# Patient Record
Sex: Female | Born: 1980
Health system: Southern US, Community
[De-identification: ages and names within clinical notes are randomized; demographics above are authoritative.]

## PROBLEM LIST (undated history)

## (undated) DIAGNOSIS — J45909 Unspecified asthma, uncomplicated: Secondary | ICD-10-CM

## (undated) DIAGNOSIS — D649 Anemia, unspecified: Secondary | ICD-10-CM

## (undated) DIAGNOSIS — T7840XA Allergy, unspecified, initial encounter: Secondary | ICD-10-CM

## (undated) DIAGNOSIS — M4300 Spondylolysis, site unspecified: Secondary | ICD-10-CM

## (undated) DIAGNOSIS — F32A Depression, unspecified: Secondary | ICD-10-CM

## (undated) DIAGNOSIS — K219 Gastro-esophageal reflux disease without esophagitis: Secondary | ICD-10-CM

## (undated) DIAGNOSIS — F419 Anxiety disorder, unspecified: Secondary | ICD-10-CM

## (undated) HISTORY — DX: Unspecified asthma, uncomplicated: J45.909

## (undated) HISTORY — DX: Depression, unspecified: F32.A

## (undated) HISTORY — DX: Allergy, unspecified, initial encounter: T78.40XA

## (undated) HISTORY — DX: Anemia, unspecified: D64.9

## (undated) HISTORY — PX: NO PAST SURGERIES: SHX2092

## (undated) HISTORY — DX: Gastro-esophageal reflux disease without esophagitis: K21.9

## (undated) HISTORY — DX: Anxiety disorder, unspecified: F41.9

---
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
## 2008-08-27 ENCOUNTER — Inpatient Hospital Stay (HOSPITAL_COMMUNITY): Admission: AD | Admit: 2008-08-27 | Discharge: 2008-08-29 | Payer: Self-pay | Admitting: Obstetrics and Gynecology

## 2011-08-22 LAB — SYPHILIS: RPR W/REFLEX TO RPR TITER AND TREPONEMAL ANTIBODIES, TRADITIONAL SCREENING AND DIAGNOSIS ALGORITHM: RPR Ser Ql: NONREACTIVE

## 2011-08-22 LAB — CBC
HCT: 33.2 — ABNORMAL LOW
HCT: 40.5
Hemoglobin: 11.4 — ABNORMAL LOW
Hemoglobin: 13.7
MCHC: 33.7
MCHC: 34.2
MCV: 84.9
MCV: 85.5
Platelets: 198
Platelets: 241
RBC: 3.88
RBC: 4.77
RDW: 13.2
RDW: 13.4
WBC: 15 — ABNORMAL HIGH
WBC: 15.6 — ABNORMAL HIGH

## 2013-10-10 ENCOUNTER — Encounter: Payer: Self-pay | Admitting: Internal Medicine

## 2013-10-10 ENCOUNTER — Ambulatory Visit (INDEPENDENT_AMBULATORY_CARE_PROVIDER_SITE_OTHER): Payer: Medicaid Other | Admitting: Internal Medicine

## 2013-10-10 VITALS — BP 104/72 | HR 103 | Temp 98.5°F | Resp 10 | Ht 64.0 in | Wt 180.0 lb

## 2013-10-10 DIAGNOSIS — E282 Polycystic ovarian syndrome: Secondary | ICD-10-CM

## 2013-10-10 DIAGNOSIS — L680 Hirsutism: Secondary | ICD-10-CM

## 2013-10-10 DIAGNOSIS — R635 Abnormal weight gain: Secondary | ICD-10-CM

## 2013-10-10 DIAGNOSIS — L708 Other acne: Secondary | ICD-10-CM

## 2013-10-10 DIAGNOSIS — L709 Acne, unspecified: Secondary | ICD-10-CM

## 2013-10-10 LAB — T3, FREE: T3, Free: 2.9 pg/mL (ref 2.3–4.2)

## 2013-10-10 NOTE — Progress Notes (Signed)
Patient ID: Carla Morris, female   DOB: 1981-01-01, 32 y.o.   MRN: [id number]  HPI: Carla Morris [facility] is a 32 y.o. female, referred by Smothers, Cathleen Corti, NP, for evaluation for PCOS due to hirsutism, acne, weight gain.   Fertility/Menstrual cycles: - started to have irregular menstrual cycles in the past 7 mo, they do come every month but at different intervals - no h/o ovarian cysts - per recent TV U/S (09/26/2013): ovaries normal, one 1.3x1.3 cm follicle in left ovary, physiologic. - children: 1, 5 y/o - miscarriages: 0, had 1 abortion - contraception: not currently sexually active - recent PAP smear normal  Acne: - started 5 years ago  Hirsutism: - on face, gotten worse over time  Weight gain: - inability to lose weight, but has been very thin before; 120 lbs before the pregnancy 6 years ago >> after birth 130-140 lbs >> now 180 lbs (~30 lbs gained this year) - no steroid use - no weight loss meds recently - Meals: - Breakfast:yoghurt, coffee - Lunch: small sandwich (tuna), soup - Dinner: chicken/fish, veggies, small portion of pasta/rice - Snacks: 1-2, small Drinks: no sodas, only water  - Diets tried:eats most vegetables, chicken, salmon, small amount of sweets, does not overeat, small meals - Exercise: some, not consistently - running after her 7 y/o son  C/o anxiety/ fatigue.  Treatments tried: - did not try Metformin - did not try Spironolactone - did not try [place] - not on OCPs  Other meds: - SSRIs: no  Labs on 09/05/2013: TSH 1.110  CMP normal Lipid panel: 190/112/42/126.1 U/A normal CBC normal  Pt has FH of overweight in mother after menopause. No FH of DM, but does not know her father's side of the family.   ROS: Constitutional: + weight gain, + fatigue, + subjective hyperthermia Eyes: no blurry vision, no xerophthalmia ENT: no sore throat, no nodules palpated in throat, no dysphagia/odynophagia, no hoarseness Cardiovascular: no  CP/SOB/palpitations/leg swelling Respiratory: no cough/SOB Gastrointestinal: no N/V/D/+ C Musculoskeletal: no muscle/joint aches Skin: + acne, + hair on face, + itching Neurological: no tremors/numbness/tingling/dizziness Psychiatric: no depression/anxiety Low libido  PE: BP 104/72  Pulse 103  Temp(Src) 98.5 F (36.9 C) (Oral)  Resp 10  Ht 5\' 4"  (1.626 m)  Wt 180 lb (81.647 kg)  BMI 30.88 kg/m2  SpO2 98% LMP 10/05/2013 Wt Readings from Last 3 Encounters:  10/10/13 180 lb (81.647 kg)   Constitutional: overweight, in NAD, no full supraclavicular fat pads Eyes: PERRLA, EOMI, no exophthalmos ENT: moist mucous membranes, no thyromegaly, no cervical lymphadenopathy Cardiovascular: RRR, No MRG Respiratory: CTA B Gastrointestinal: abdomen soft, NT, ND, BS+ Musculoskeletal: no deformities, strength intact in all 4 Skin: moist, warm; + acne on face, + dark terminal hair on chin, + vellum on sideburns, no skin tags, no acanthosis nigricans, no purple, wide, stretch marks Neurological: no tremor with outstretched hands, DTR normal in all 4  ASSESSMENT: 1. ?PCOS  PLAN: 1.  I had a long discussion with the patient about the fact that the PCOS is a misnomer, the patient does not actually have to have polycystic ovaries to be diagnosed with the disorder (her TV U/S was normal). This is of sum of several conditions, including insulin resistance (and therefore a higher risk of developing diabetes later in life), weight gain, acne, hirsutism, irregular menstrual cycles, and decreased fertility. - We also discussed about the fact that the treatment is usually targeted to addressing the problem that concerns the patient the most:  acne/hirsutism, weight gain, or fertility, but there is no single treatment for PCOS. The first-line therapy are oral contraceptives. If she is concerned with her weight, we can use metformin; it she is concerned about acne/hirsutism, we can add spironolactone; and if she  is concerned about fertility, I could refer her to reproductive endocrinology for possible use of clomiphene. She is not planning for a pregnancy any time soon, she mentions. - we will first try to see if she has PCOS and r/o prolactinoma, [place]-CAH, DM2/prediabetes, and hypothyroidism - if she does have PCOS, will try OCPs first. Orders Placed This Encounter  Procedures  . LH  . FSH  . Estradiol  . Testosterone, free, total  . Prolactin  . Hemoglobin A1c  . TSH  . T4, free  . T3, free  . Androstenedione  . 17-Hydroxyprogesterone  We can check these today since she is day 6 of her cycle (although ideally checked in day 3). - advised her to join MyChart - will see her back in 4 mo - if the labs are abnormal  PCP: Dr Tracey Harries [facility] [phone])  Office Visit on 10/10/2013  Component Date Value Range Status  . [facility] 10/10/2013 5.37   Final   Comment: Female Reference Range:20-70 yrs     1.5-9.3 mIU/mL>70 yrs       3.1-35.6 mIU/mLFemale Reference Range:Follicular Phase     1.9-12.5 mIU/mLMidcycle             8.7-76.3 mIU/mLLuteal Phase         0.5-16.9 mIU/mL  Post Menopausal      15.9-54.0                           mIU/mLPregnant             <1.5 mIU/mLContraceptives       0.7-5.6 mIU/mL  . [facility] 10/10/2013 8.9   Final   Female Reference Range:  1.4-18.1 mIU/mLFemale Reference Range:Follicular Phase          2.5-10.2 mIU/mLMidCycle Peak          3.4-33.4 mIU/mLLuteal Phase          1.5-9.1 mIU/mLPost Menopausal     23.0-116.3 mIU/mLPregnant          <0.3 mIU/mL  . Estradiol, Free 10/10/2013 0.67   Final   Comment: FEMALE REFERENCE RANGES FOR ESTRADIOL, FREE:                            Follicular Stage:  0.43-5.03 pg/mL                            Mid-cycle Stage:   0.72-5.89 pg/mL                            Luteal Stage:      0.40-5.55 pg/mL                            Postmenopausal:    < or = 0.38 pg/mL  . Estradiol 10/10/2013 32   Final   Comment: FEMALE REFERENCE RANGES FOR  ESTRADIOL:                            Follicular Stage:  39-375  pg/mL                            Mid-cycle Stage:   94-762 pg/mL                            Luteal Stage:      48-440 pg/mL                            Postmenopausal:    < or = 10 pg/mL  . Results Received 10/10/2013 10/21/13   Final   Comment: Reference lab accession: [id number]                          Test performed by:                                     [facility]                                     [address] [place], [place] [address]                                     Phone:  [phone]                          Director:  Pat Patrick, M.D.  . Testosterone 10/10/2013 45  10 - 70 ng/dL Final   Comment:           Tanner Stage       Female              Female                                        I              < 30 ng/dL        < 10 ng/dL                                        II             < 150 ng/dL       < 30 ng/dL                                        III            100-320 ng/dL     < 35 ng/dL  IV             200-970 ng/dL     16-10 ng/dL                                        V/Adult        300-890 ng/dL     96-04 ng/dL                             . Sex Hormone Binding 10/10/2013 28  18 - 114 nmol/L Final  . Testosterone, Free 10/10/2013 8.9* 0.6 - 6.8 pg/mL Final   Comment:                            The concentration of free testosterone is derived from a mathematical                          expression based on constants for the binding of testosterone to sex                          hormone-binding globulin and albumin.  . Testosterone-% Free 10/10/2013 2.0  0.4 - 2.4 % Final  . Prolactin 10/10/2013 2.8   Final   Comment:      Reference Ranges:                                           Female:                       2.1 -  17.1 ng/ml                                           Female:   Pregnant           9.7 - 208.5 ng/mL                                                     Non Pregnant      2.8 -  29.2 ng/mL                                                     Post Menopausal   1.8 -  20.3 ng/mL                                              . Hemoglobin A1C 10/10/2013 5.2  4.6 - 6.5 % Final   Glycemic Control Guidelines for People with Diabetes:Non Diabetic:  <6%Goal of Therapy: <7%Additional Action Suggested:  >8%   . TSH 10/10/2013 0.91  0.35 -  5.50 uIU/mL Final  . Free T4 10/10/2013 0.80  0.60 - 1.60 ng/dL Final  . T3, Free 16/08/9603 2.9  2.3 - 4.2 pg/mL Final  . Androstenedione 10/10/2013 93   Final   Comment:                            Adult Female Reference Ranges for                            Androstenedione, Serum:                                                        Follicular Phase:      35-250 ng/dL                             Luteal Phase:          30-235 ng/dL                             Postmenopausal Phase:  20-75  ng/dL   Mild PCOS. Normal TFTs. Normal prolactin. The 17 HO Progesterone could not be run (was not collected in the appropriate tube), we can check it when she comes back. I have low suspicion for [place]-CAH. Will start OCPs.

## 2013-10-10 NOTE — Patient Instructions (Signed)
Please come back for a follow-up appointment in 4 months. Please stop at the lab. Please join MyChart >> I will send you the results through there. We will decide for further plan when results are back.

## 2013-10-11 LAB — PROLACTIN: Prolactin: 2.8 ng/mL

## 2013-10-13 LAB — TESTOSTERONE, FREE, TOTAL, SHBG
Sex Hormone Binding: 28 nmol/L (ref 18–114)
Testosterone-% Free: 2 % (ref 0.4–2.4)
Testosterone: 45 ng/dL (ref 10–70)

## 2013-10-14 ENCOUNTER — Telehealth: Payer: Self-pay | Admitting: Internal Medicine

## 2013-10-14 NOTE — Telephone Encounter (Signed)
Carla Morris, Please have the schedulers change PCP in the system to him. I will send him the note, but I usually prefer to have everything back before I do so - some labs are still pending.

## 2013-10-14 NOTE — Telephone Encounter (Signed)
Carla Morris at Dr Bonney Leitz office, [facility] [phone]) and advised her that some of the pt's labs are still pending. She stated ok to wait for labs and then send a completed report/notes. Be advised.

## 2013-10-14 NOTE — Telephone Encounter (Signed)
Primary care physician, Dr Tracey Harries is requesting referral notes to be faxed to their office at [phone] attn: Grover Canavan; Call back#[phone], for any questions. Please advise.

## 2013-10-14 NOTE — Telephone Encounter (Signed)
OK, will definitely send the report when note completed.

## 2013-10-21 LAB — ESTRADIOL, FREE

## 2013-10-24 ENCOUNTER — Encounter: Payer: Self-pay | Admitting: Internal Medicine

## 2013-10-24 DIAGNOSIS — E282 Polycystic ovarian syndrome: Secondary | ICD-10-CM | POA: Insufficient documentation

## 2013-10-24 MED ORDER — NORGESTIMATE-ETH ESTRADIOL 0.25-35 MG-MCG PO TABS: 1.0000 | ORAL_TABLET | Freq: Every day | ORAL | Status: DC

## 2014-02-06 ENCOUNTER — Encounter: Payer: Self-pay | Admitting: Internal Medicine

## 2014-02-06 ENCOUNTER — Ambulatory Visit (INDEPENDENT_AMBULATORY_CARE_PROVIDER_SITE_OTHER): Payer: Medicaid Other | Admitting: Internal Medicine

## 2014-02-06 VITALS — BP 118/62 | HR 100 | Temp 97.9°F | Resp 12 | Wt 185.0 lb

## 2014-02-06 DIAGNOSIS — E282 Polycystic ovarian syndrome: Secondary | ICD-10-CM

## 2014-02-06 MED ORDER — NORGESTIMATE-ETH ESTRADIOL 0.25-35 MG-MCG PO TABS: 1.0000 | ORAL_TABLET | Freq: Every day | ORAL | Status: DC

## 2014-02-06 MED ORDER — METFORMIN HCL 500 MG PO TABS: 1000.0000 mg | ORAL_TABLET | Freq: Two times a day (BID) | ORAL | Status: DC

## 2014-02-06 NOTE — Patient Instructions (Signed)
Please start Metformin 500 mg with dinner x 5 days. If you tolerate this well, add another Metformin tablet (500 mg) with breakfast x 5 days. If you tolerate this well, add another metformin tablet with dinner (total 1000 mg) x 5 days. If you tolerate this well, add another metformin tablets with breakfast (total 1000 mg). Continue with 1000 mg of metformin twice a day with breakfast and dinner.  Try to get the Metformin from [facility] or get the brand name Glucophage.   Please return in 6 months.

## 2014-02-06 NOTE — Progress Notes (Signed)
Patient ID: Carla Morris, female   DOB: [birth year], 33 y.o.   MRN: [id number]  HPI: Carla Morris is a 33 y.o. female, returning for f/iu for PCOS. Last visit 4 mo ago.   Works out at [employer] and tried to have a [employer] but does not lose weight. She is very frustrated about this.  Reviewed hx: Fertility/Menstrual cycles: - started to have irregular menstrual cycles in the past 7 mo, they do come every month but at different intervals - no h/o ovarian cysts - per recent TV U/S (09/26/2013): ovaries normal, one 1.3x1.3 cm follicle in left ovary, physiologic. - children: 1, 5 y/o - miscarriages: 0, had 1 abortion - contraception: not currently sexually active - recent PAP smear normal  Acne: - started 5 years ago  Hirsutism: - on face, gotten worse over time  Weight gain: - inability to lose weight, but has been very thin before; 120 lbs before the pregnancy 6 years ago >> after birth 130-140 lbs >> now 180 lbs (~30 lbs gained this year) - no steroid use - no weight loss meds recently - Meals: - Breakfast:yoghurt, coffee - Lunch: small sandwich (tuna), soup - Dinner: chicken/fish, veggies, small portion of pasta/rice - Snacks: 1-2, small Drinks: no sodas, only water  - Diets tried:eats most vegetables, chicken, salmon, small amount of sweets, does not overeat, small meals - Exercise: some, not consistently - running after her 74 y/o son  C/o anxiety/ fatigue.  Treatments tried: - did not try Metformin - did not try Spironolactone - did not try [place] - started on OCPs 09/2013  Other meds: - SSRIs: no  Labs on 09/05/2013: TSH 1.110  CMP normal Lipid panel: 190/112/42/126.1 U/A normal CBC normal  At last visit, we checked the following labs: Component     Latest Ref Rng 10/10/2013  Testosterone     10 - 70 ng/dL 45  Sex Hormone Binding     18 - 114 nmol/L 28  Testosterone Free     0.6 - 6.8 pg/mL 8.9 (H)  Testosterone-% Free     0.4 - 2.4 % 2.0   Estradiol, Free      0.67  Estradiol      32  Results received      10/21/13  LH      5.37  FSH      8.9  Prolactin      2.8  Hemoglobin A1C     4.6 - 6.5 % 5.2  TSH     0.35 - 5.50 uIU/mL 0.91  Free T4     0.60 - 1.60 ng/dL 4.09  T3, Free     2.3 - 4.2 pg/mL 2.9  Androstenedione      93   Dx: mild PCOS. Normal TFTs. Normal prolactin. We started OCPs, she does well on these. The 17 HO Progesterone could not be run (was not collected in the appropriate tube). However, I have low suspicion for [place]-CAH.  She c/o still having hirsutism and weight gain despite healthy diet and exercising.   I reviewed pt's medications, allergies, PMH, social hx, family hx and no changes required, except as mentioned above.  ROS: Constitutional: + weight gain, + fatigue, + subjective hyperthermia Eyes: no blurry vision, no xerophthalmia ENT: no sore throat, no nodules palpated in throat, no dysphagia/odynophagia, no hoarseness Cardiovascular: no CP/SOB/palpitations/leg swelling Respiratory: no cough/SOB Gastrointestinal: no N/V/D/C Musculoskeletal: no muscle/joint aches Skin: no acne, + hair on face Neurological: no tremors/numbness/tingling/dizziness  PE:  BP 118/62  Pulse 100  Temp(Src) 97.9 F (36.6 C) (Oral)  Resp 12  Wt 185 lb (83.915 kg)  SpO2 96% LMP 10/05/2013 Wt Readings from Last 3 Encounters:  02/06/14 185 lb (83.915 kg)  10/10/13 180 lb (81.647 kg)   Constitutional: overweight, in NAD, no full supraclavicular fat pads Eyes: PERRLA, EOMI, no exophthalmos ENT: moist mucous membranes, no thyromegaly, no cervical lymphadenopathy Cardiovascular: RRR, No MRG Respiratory: CTA B Gastrointestinal: abdomen soft, NT, ND, BS+ Musculoskeletal: no deformities, strength intact in all 4 Skin: moist, warm; + dark terminal hair on chin, + vellum on sideburns, no skin tags, no acanthosis nigricans, no purple, wide, stretch marks Neurological: no tremor with outstretched hands, DTR  normal in all 4  ASSESSMENT: 1. Mild PCOS - TV U/S was normal - mild increase in free T (8.9)  PLAN: 1.  Pt with mild PCOS, doing well on OCPs, although with not much improvement in hirsutism - we discussed about Spironolactone >> we ann add this at next visit; I advised her to give the OCPs at least few more months due to the slow impact on the hair shafts - for help with weight loss, we can add Metformin - start low dose and advance to 1000 mg bid. She agrees with this. - will need a new HbA1c, testosterone at next visit. She was out of OCPs x 2 weeks now so will check testosterone at next visit - refilled OCPs - will see her back in 6 mo   PCP: Dr Tracey Harriesavid [facility] [phone])

## 2014-08-06 ENCOUNTER — Telehealth: Payer: Self-pay | Admitting: *Deleted

## 2014-08-06 ENCOUNTER — Encounter: Payer: Self-pay | Admitting: *Deleted

## 2014-08-06 NOTE — Telephone Encounter (Signed)
Sent pt the message via MyChart.

## 2014-08-06 NOTE — Telephone Encounter (Signed)
Message from MyChart Pt Questionnaire submission:  Is there anything else you would like to ask your physician? Are there any clinical trials for PCOS?   Please advise.

## 2014-08-06 NOTE — Telephone Encounter (Signed)
Yes:  She can go to clinicaltrials.gov and search by "PCOS" - or use this link:  [employer]be

## 2014-08-10 ENCOUNTER — Encounter: Payer: Self-pay | Admitting: Internal Medicine

## 2014-08-10 ENCOUNTER — Ambulatory Visit (INDEPENDENT_AMBULATORY_CARE_PROVIDER_SITE_OTHER): Payer: Medicaid Other | Admitting: Internal Medicine

## 2014-08-10 VITALS — BP 118/68 | HR 98 | Temp 98.2°F | Resp 12 | Wt 188.0 lb

## 2014-08-10 DIAGNOSIS — E282 Polycystic ovarian syndrome: Secondary | ICD-10-CM

## 2014-08-10 MED ORDER — SPIRONOLACTONE 50 MG PO TABS: 50.0000 mg | ORAL_TABLET | Freq: Two times a day (BID) | ORAL | Status: DC

## 2014-08-10 MED ORDER — METFORMIN HCL ER 500 MG PO TB24: ORAL_TABLET | ORAL | Status: DC

## 2014-08-10 NOTE — Patient Instructions (Signed)
Please collect the urine collection and bring it to the lab in a week.  Patient information (Up-to-Date): Collection of a 24-hour urine specimen  - You should collect every drop of urine during each 24-hour period. It does not matter how much or little urine is passed each time, as long as every drop is collected. - Begin the urine collection in the morning after you wake up, after you have emptied your bladder for the first time. - Urinate (empty the bladder) for the first time and flush it down the toilet. Note the exact time (eg, 6:15 AM). You will begin the urine collection at this time. - Collect every drop of urine during the day and night in an empty collection bottle. Store the bottle at room temperature or in the refrigerator. - If you need to have a bowel movement, any urine passed with the bowel movement should be collected. Try not to include feces with the urine collection. If feces does get mixed in, do not try to remove the feces from the urine collection bottle. - Finish by collecting the first urine passed the next morning, adding it to the collection bottle. This should be within ten minutes before or after the time of the first morning void on the first day (which was flushed). In this example, you would try to void between 6:05 and 6:25 on the second day. - If you need to urinate one hour before the final collection time, drink a full glass of water so that you can void again at the appropriate time. If you have to urinate 20 minutes before, try to hold the urine until the proper time. - Please note the exact time of the final collection, even if it is not the same time as when collection began on day 1. - The bottle(s) may be kept at room temperature for a day or two, but should be kept cool or refrigerated for longer periods of time.  Please start Spironolactone 50 mg daily at dinnertime, and after 2 days, start 50 mg 2x a day. Please go to your PCP's office for a nurse visit for BP  check and for labs 1 week after you start Spironolactone.  Please start Metformin 500 mg with dinner x 4 days. If you tolerate this well, add another Metformin tablet (500 mg) with breakfast x 4 days. If you tolerate this well, add another metformin tablet with dinner (total 1000 mg) x 4 days. If you tolerate this well, add another metformin tablet with breakfast (total 1000 mg). Continue with 1000 mg of metformin 2x a day with breakfast and dinner.  Please come back for a follow-up appointment in 6 months

## 2014-08-10 NOTE — Progress Notes (Signed)
Patient ID: Carla Morris, female   DOB: Apr 16, 1981, 33 y.o.   MRN: [id number]  HPI: Sherel Fennell Gonsoulin is a 33 y.o. female, returning for f/iu for PCOS. Last visit 6 mo ago.   We started Metformin 1000 mg bid after last visit. She stopped Metformin (diarrhea) and OCPs (weight gain) 1 month ago >> weight decreased from 193 >> 187 lbs.  She is again very frustrated about her weight. No exercise now as she is very busy, but tries to have a healthy diet. She feels that she should lose weight on her diet.  She c/o still having hirsutism and weight gain despite healthy diet and being on OCPs.   Reviewed hx: Fertility/Menstrual cycles: - started to have irregular menstrual cycles in the past 7 mo, they do come every month but at different intervals - no h/o ovarian cysts - per recent TV U/S (09/26/2013): ovaries normal, one 1.3x1.3 cm follicle in left ovary, physiologic. - children: 1, 5 y/o - miscarriages: 0, had 1 abortion - contraception: not currently sexually active - recent PAP smear normal  Acne: - started 5 years ago  Hirsutism: - on face, gotten worse over time  Weight gain: - inability to lose weight, but has been very thin before; 120 lbs before the pregnancy 6 years ago >> after birth 130-140 lbs >> now 180 lbs (~30 lbs gained this year) - no steroid use - no weight loss meds recently - Meals: - Breakfast:yoghurt, coffee - Lunch: small sandwich (tuna), soup - Dinner: chicken/fish, veggies, small portion of pasta/rice - Snacks: 1-2, small Drinks: no sodas, only water  - Diets tried:eats most vegetables, chicken, salmon, small amount of sweets, does not overeat, small meals - Exercise: some, not consistently - running after her 47 y/o son  Treatments tried: - OCPs   Other meds: - SSRIs: no  I reviewed pt's medications, allergies, PMH, social hx, family hx and no changes required, except as mentioned above.  ROS: Constitutional: + weight gain, then loss, + fatigue, +  subjective hyperthermia (hot flushes) Eyes: no blurry vision, no xerophthalmia ENT: no sore throat, no nodules palpated in throat, no dysphagia/odynophagia, no hoarseness Cardiovascular: no CP/SOB/no palpitations/leg swelling Respiratory: no cough/SOB Gastrointestinal: no N/V/D/C Musculoskeletal: no muscle aches/no joint aches Skin: no acne, + hair on face Neurological: no tremors/numbness/tingling/dizziness  PE: BP 118/68  Pulse 98  Temp(Src) 98.2 F (36.8 C) (Oral)  Resp 12  Wt 188 lb (85.276 kg)  SpO2 95% LMP 10/05/2013 Wt Readings from Last 3 Encounters:  08/10/14 188 lb (85.276 kg)  02/06/14 185 lb (83.915 kg)  10/10/13 180 lb (81.647 kg)   Constitutional: overweight, in NAD, no full supraclavicular fat pads Eyes: PERRLA, EOMI, no exophthalmos ENT: moist mucous membranes, no thyromegaly, no cervical lymphadenopathy Cardiovascular: RRR, No MRG Respiratory: CTA B Gastrointestinal: abdomen soft, NT, ND, BS+ Musculoskeletal: no deformities, strength intact in all 4 Skin: moist, warm; + dark terminal hair on chin, + vellum on sideburns, no skin tags, no acanthosis nigricans, no purple, wide, stretch marks Neurological: no tremor with outstretched hands, DTR normal in all 4  ASSESSMENT: 1. Mild PCOS - TV U/S was normal - mild increase in free T (8.9) Labs on 09/05/2013: TSH 1.110  CMP normal Lipid panel: 190/112/42/126.1 U/A normal CBC normal  Component     Latest Ref Rng 10/10/2013  Testosterone     10 - 70 ng/dL 45  Sex Hormone Binding     18 - 114 nmol/L 28  Testosterone Free  0.6 - 6.8 pg/mL 8.9 (H)  Testosterone-% Free     0.4 - 2.4 % 2.0  Estradiol, Free      0.67  Estradiol      32  Results received      10/21/13  LH      5.37  FSH      8.9  Prolactin      2.8  Hemoglobin A1C     4.6 - 6.5 % 5.2  TSH     0.35 - 5.50 uIU/mL 0.91  Free T4     0.60 - 1.60 ng/dL 1.61  T3, Free     2.3 - 4.2 pg/mL 2.9  Androstenedione      93  Dx: mild  PCOS. Normal TFTs. Normal prolactin. The 17 HO Progesterone could not be run (was not collected in the appropriate tube). However, low suspicion for Tinsman-CAH.  PLAN: 1.  Pt with mild PCOS, was doing well on OCPs, although with not much improvement in hirsutism and with increase weight gain >> stopped 1 mo ago, and does not want to restart. She does have menses every month. - we discussed about Spironolactone >> we will add this now: 50 mg bid >> needs nurse visit for BP check and lab visit for potassium in 1 week. - for help with weight loss, we can add MetforminXR  - start low dose and advance to 1000 mg bid. She agrees with this.  - will need a new HbA1c, testosterone at next lab visit. I will add a 24h urine free cortisol level to r/o [place] sd. Although I have low suspicion for this. - will see her back in 6 mo   Patient Instructions  Please collect the urine collection and bring it to the lab in a week.  Patient information (Up-to-Date): Collection of a 24-hour urine specimen  - You should collect every drop of urine during each 24-hour period. It does not matter how much or little urine is passed each time, as long as every drop is collected. - Begin the urine collection in the morning after you wake up, after you have emptied your bladder for the first time. - Urinate (empty the bladder) for the first time and flush it down the toilet. Note the exact time (eg, 6:15 AM). You will begin the urine collection at this time. - Collect every drop of urine during the day and night in an empty collection bottle. Store the bottle at room temperature or in the refrigerator. - If you need to have a bowel movement, any urine passed with the bowel movement should be collected. Try not to include feces with the urine collection. If feces does get mixed in, do not try to remove the feces from the urine collection bottle. - Finish by collecting the first urine passed the next morning, adding it to the  collection bottle. This should be within ten minutes before or after the time of the first morning void on the first day (which was flushed). In this example, you would try to void between 6:05 and 6:25 on the second day. - If you need to urinate one hour before the final collection time, drink a full glass of water so that you can void again at the appropriate time. If you have to urinate 20 minutes before, try to hold the urine until the proper time. - Please note the exact time of the final collection, even if it is not the same time as when collection began  on day 1. - The bottle(s) may be kept at room temperature for a day or two, but should be kept cool or refrigerated for longer periods of time.  Please start Spironolactone 50 mg daily at dinnertime, and after 2 days, start 50 mg 2x a day. Please go to your PCP's office for a nurse visit for BP check and for labs 1 week after you start Spironolactone.  Please start Metformin 500 mg with dinner x 4 days. If you tolerate this well, add another Metformin tablet (500 mg) with breakfast x 4 days. If you tolerate this well, add another metformin tablet with dinner (total 1000 mg) x 4 days. If you tolerate this well, add another metformin tablet with breakfast (total 1000 mg). Continue with 1000 mg of metformin 2x a day with breakfast and dinner.  Please come back for a follow-up appointment in 6 months   PCP: Dr Tracey Harries, NP [facility] [phone])

## 2015-01-21 DIAGNOSIS — F32A Depression, unspecified: Secondary | ICD-10-CM | POA: Insufficient documentation

## 2015-02-01 ENCOUNTER — Encounter: Payer: Self-pay | Admitting: Internal Medicine

## 2015-02-08 ENCOUNTER — Ambulatory Visit: Payer: Medicaid Other | Admitting: Internal Medicine

## 2015-02-12 ENCOUNTER — Encounter: Payer: Self-pay | Admitting: Internal Medicine

## 2015-02-15 NOTE — Telephone Encounter (Signed)
Please check into this.  Thank you.

## 2016-12-15 ENCOUNTER — Emergency Department (HOSPITAL_BASED_OUTPATIENT_CLINIC_OR_DEPARTMENT_OTHER): Payer: Medicaid Other

## 2016-12-15 ENCOUNTER — Emergency Department (HOSPITAL_BASED_OUTPATIENT_CLINIC_OR_DEPARTMENT_OTHER)
Admission: EM | Admit: 2016-12-15 | Discharge: 2016-12-15 | Disposition: A | Discharge status: Home / Self Care | Payer: Medicaid Other | Attending: Emergency Medicine | Admitting: Emergency Medicine

## 2016-12-15 ENCOUNTER — Encounter (HOSPITAL_BASED_OUTPATIENT_CLINIC_OR_DEPARTMENT_OTHER): Payer: Self-pay | Admitting: *Deleted

## 2016-12-15 DIAGNOSIS — Z79899 Other long term (current) drug therapy: Secondary | ICD-10-CM | POA: Diagnosis not present

## 2016-12-15 DIAGNOSIS — M545 Low back pain, unspecified: Secondary | ICD-10-CM

## 2016-12-15 DIAGNOSIS — Z7984 Long term (current) use of oral hypoglycemic drugs: Secondary | ICD-10-CM | POA: Diagnosis not present

## 2016-12-15 HISTORY — DX: Spondylolysis, site unspecified: M43.00

## 2016-12-15 LAB — PREGNANCY, URINE: Preg Test, Ur: NEGATIVE

## 2016-12-15 MED ORDER — ONDANSETRON 4 MG PO TBDP
4.0000 mg | ORAL_TABLET | Freq: Once | ORAL | Status: AC
  Administered 2016-12-15: 4 mg via ORAL
  Filled 2016-12-15: qty 1

## 2016-12-15 MED ORDER — DIAZEPAM 5 MG PO TABS
10.0000 mg | ORAL_TABLET | Freq: Once | ORAL | Status: AC
  Administered 2016-12-15: 10 mg via ORAL
  Filled 2016-12-15: qty 2

## 2016-12-15 MED ORDER — ONDANSETRON 4 MG PO TBDP
ORAL_TABLET | ORAL | Status: AC
  Administered 2016-12-15: 4 mg via ORAL
  Filled 2016-12-15: qty 1

## 2016-12-15 MED ORDER — NAPROXEN 500 MG PO TABS: 500.0000 mg | ORAL_TABLET | Freq: Two times a day (BID) | ORAL | 0 refills | Status: DC

## 2016-12-15 MED ORDER — METHOCARBAMOL 500 MG PO TABS
1000.0000 mg | ORAL_TABLET | Freq: Once | ORAL | Status: AC
Start: 2016-12-15 — End: 2016-12-15
  Administered 2016-12-15: 1000 mg via ORAL
  Filled 2016-12-15: qty 2

## 2016-12-15 MED ORDER — HYDROMORPHONE HCL 1 MG/ML IJ SOLN
1.0000 mg | Freq: Once | INTRAMUSCULAR | Status: AC
  Administered 2016-12-15: 1 mg via INTRAVENOUS
  Filled 2016-12-15: qty 1

## 2016-12-15 MED ORDER — OXYCODONE-ACETAMINOPHEN 5-325 MG PO TABS
2.0000 | ORAL_TABLET | Freq: Once | ORAL | Status: AC
  Administered 2016-12-15: 2 via ORAL
  Filled 2016-12-15: qty 2

## 2016-12-15 MED ORDER — OXYCODONE HCL 5 MG PO TABS: 5.0000 mg | ORAL_TABLET | ORAL | 0 refills | Status: DC | PRN

## 2016-12-15 MED ORDER — KETOROLAC TROMETHAMINE 60 MG/2ML IM SOLN
60.0000 mg | Freq: Once | INTRAMUSCULAR | Status: AC
  Administered 2016-12-15: 60 mg via INTRAMUSCULAR
  Filled 2016-12-15: qty 2

## 2016-12-15 MED ORDER — METHOCARBAMOL 500 MG PO TABS: 500.0000 mg | ORAL_TABLET | Freq: Two times a day (BID) | ORAL | 0 refills | Status: DC

## 2016-12-15 MED FILL — oxyCODONE HCL 5 MG TABS: 5 | 2 days supply | Qty: 15 | Fill #0

## 2016-12-15 MED FILL — NAPROXEN 500 MG TABLET: 500 | 15 days supply | Qty: 30 | Fill #0

## 2016-12-15 MED FILL — METHOCARBAMOL 500 MG TABLET: 500 | 10 days supply | Qty: 20 | Fill #0

## 2016-12-15 NOTE — ED Provider Notes (Signed)
[facility] Note   CSN: [id number] Arrival date & time: 12/15/16  1126     History   Chief Complaint Chief Complaint  Patient presents with  . Back Pain    HPI Carla Morris is a 36 y.o. 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 Patient reports pain radiates to the sides but not down into her buttock or legs. She reports she has been able to walk and drive with pain but without weakness. No loss of bowel or bladder control.  He denies abdominal pain, nausea or vomiting. She has taken naproxen and Robaxin without relief. Patient reports chronic aching of her back due to her pars defect but does not regularly take medication for this.  She's never had pain like this before.   The history is provided by the patient and medical records. No language interpreter was used.    Past Medical History:  Diagnosis Date  . Pars defect     Patient Active Problem List   Diagnosis Date Noted  . PCOS (polycystic ovarian syndrome) 10/24/2013    History reviewed. No pertinent surgical history.  OB History    No data available       Home Medications    Prior to Admission medications   Medication Sig Start Date End Date Taking? Authorizing Provider  Fiber-Vit C-Calcium (FIBER/C/EXTRA CALCIUM PO) Take 1 each by mouth daily.    Historical Provider, MD  metFORMIN (GLUCOPHAGE-XR) 500 MG 24 hr tablet Take by mouth 1000 mg 2x a day with meals 08/10/14   Carlus Pavlovristina Gherghe, MD  methocarbamol (ROBAXIN) 500 MG tablet Take 1 tablet (500 mg total) by mouth 2 (two) times daily. 12/15/16   Winfrey Chillemi, PA-C  naproxen (NAPROSYN) 500 MG tablet Take 1 tablet (500 mg total) by mouth 2 (two) times daily  with a meal. 12/15/16   Terrez Ander, PA-C  Omega-3 Fatty Acids (FISH OIL) 300 MG CAPS Take 1 capsule by mouth daily.    Historical Provider, MD  oxyCODONE (ROXICODONE) 5 MG immediate release tablet Take 1 tablet (5 mg total) by mouth every 4 (four) hours as needed for severe pain. 12/15/16   Korynne Dols, PA-C  spironolactone (ALDACTONE) 50 MG tablet Take 1 tablet (50 mg total) by mouth 2 (two) times daily. 08/10/14   Carlus Pavlovristina Gherghe, MD  VITAMIN K PO Take 1 capsule by mouth daily.    Historical Provider, MD    Family History No family history on file.  Social History Social History  Substance Use Topics  . Smoking status: Never Smoker  . Smokeless tobacco: Never Used  . Alcohol use No     Allergies   Aspirin   Review of Systems Review of Systems  Constitutional: Negative for fatigue and fever.  Respiratory: Negative for chest tightness and shortness of breath.   Cardiovascular: Negative for chest pain.  Gastrointestinal: Negative for abdominal pain, diarrhea, nausea and vomiting.  Genitourinary: Negative for dysuria, frequency, hematuria and urgency.  Musculoskeletal: Positive for back pain. Negative for gait problem, joint swelling, neck pain and neck stiffness.  Skin: Negative for rash.  Neurological: Negative for weakness, light-headedness, numbness and headaches.  All other systems reviewed and are negative.  Physical Exam Updated Vital Signs BP 127/94   Pulse 83   Temp 98.1 F (36.7 C) (Oral)   Resp 18   Ht 5' 3.5" (1.613 m)   Wt 86.2 kg   LMP 12/14/2016   SpO2 100%   BMI 33.13 kg/m   Physical Exam  Constitutional: She appears well-developed and well-nourished. No distress.  HENT:  Head: Normocephalic and atraumatic.  Mouth/Throat: Oropharynx is clear and moist. No oropharyngeal exudate.  Eyes: Conjunctivae are normal.  Neck: Normal range of motion. Neck supple.  Full ROM without pain  Cardiovascular: Normal rate, regular rhythm and  intact distal pulses.   Pulmonary/Chest: Effort normal and breath sounds normal. No respiratory distress. She has no wheezes.  Abdominal: Soft. She exhibits no distension. There is no tenderness.  Musculoskeletal:  Decreased range of motion of the T-spine and L-spine due to severe pain in the L-spine with movement No midline tenderness to the  T-spine with some midline tenderness to the L-spine Tenderness to palpation of the bilateral paraspinous muscles of the L-spine  Lymphadenopathy:    She has no cervical adenopathy.  Neurological: She is alert. She has normal reflexes.  Reflex Scores:      Patellar reflexes are 2+ on the right side and 2+ on the left side.      Achilles reflexes are 2+ on the right side and 2+ on the left side. Speech is clear and goal oriented, follows commands Normal 5/5 strength in lower extremities bilaterally including dorsiflexion and plantar flexion Sensation normal to light and sharp touch Moves extremities without ataxia, coordination intact Antalgic and slow gait, no foot drop or dragging of either leg Normal balance No Clonus  Skin: Skin is warm and dry. No rash noted. She is not diaphoretic. No erythema.  Psychiatric: She has a normal mood and affect. Her behavior is normal.  Nursing note and vitals reviewed.    ED Treatments / Results  Labs (all labs ordered are listed, but only abnormal results are displayed) Labs Reviewed  PREGNANCY, URINE     Radiology Dg Lumbar Spine Complete  Result Date: 12/15/2016 CLINICAL DATA:  Low back pain EXAM: LUMBAR SPINE - COMPLETE 4+ VIEW COMPARISON:  12/22/2015 FINDINGS: Suspect L5 pars defects as seen on prior study. Normal alignment. No acute fracture. Disc spaces are maintained. IMPRESSION: Suspect L5 pars defects.  No acute findings. Electronically Signed   By: Charlett Nose M.D.   On: 12/15/2016 12:24    Procedures Procedures (including critical care time)  Medications Ordered in ED Medications    oxyCODONE-acetaminophen (PERCOCET/ROXICET) 5-325 MG per tablet 2 tablet (2 tablets Oral Given 12/15/16 1244)  diazepam (VALIUM) tablet 10 mg (10 mg Oral Given 12/15/16 1244)  ketorolac (TORADOL) injection 60 mg (60 mg Intramuscular Given 12/15/16 1500)  HYDROmorphone (DILAUDID) injection 1 mg (1 mg Intravenous Given 12/15/16 1552)  methocarbamol (ROBAXIN) tablet 1,000 mg (1,000 mg Oral Given 12/15/16 1653)     Initial Impression / Assessment and Plan / ED Course  I have reviewed the triage vital signs and the nursing notes.  Pertinent labs & imaging results that were available during my care of the patient were reviewed by me and considered in my medical decision making (see chart for details).  Clinical Course as of Dec 15 1702  Fri Dec 15, 2016  1300 No acute abnormality including no fracture noted. Patient continues to have significant pain. She was able to ambulate to the bathroom but required assistance with a walker. She remains without  weakness in her legs but does have worsening pain with movement.  No radiation of the pain. DG Lumbar Spine Complete [HM]  1345 She continues to have back pain with movement. She is able to roll in the bed without difficulty.  [HM]  1640 Patient with improved pain after Dilaudid administration. She is able to ambulate unassisted in the hallway but reports continued pain.  [HM]  1703 [place] narcotics database accessed and patient has not filled any narcotic prescriptions in the last year.  [HM]    Clinical Course User Index [HM] Hiawatha Merriott, PA-C    No acute abnormalities of patient's x-ray. She remains neurologically intact. No evidence of cauda equina. Patient requiring multiple doses of pain control and muscle relaxers however is not ambulatory without assistance. She is to see her primary care provider in 2 days for further evaluation. Pt states understanding and is in agreement with the plan.  Final Clinical Impressions(s) / ED Diagnoses   Final  diagnoses:  Acute midline low back pain without sciatica    New Prescriptions New Prescriptions   METHOCARBAMOL (ROBAXIN) 500 MG TABLET    Take 1 tablet (500 mg total) by mouth 2 (two) times daily.   NAPROXEN (NAPROSYN) 500 MG TABLET    Take 1 tablet (500 mg total) by mouth 2 (two) times daily with a meal.   OXYCODONE (ROXICODONE) 5 MG IMMEDIATE RELEASE TABLET    Take 1 tablet (5 mg total) by mouth every 4 (four) hours as needed for severe pain.     Dahlia Client Benjamin Casanas, PA-C 12/15/16 1705    Jacalyn Lefevre, MD 12/18/16 1500

## 2016-12-15 NOTE — ED Notes (Signed)
Patient is pale and diaphoretic. The patient also has nausea. PA aware and meds given

## 2016-12-15 NOTE — ED Notes (Signed)
Attempted to stand the patient. The patient is screaming and yelling with any movement. Patient unable to stand without yelling in pain.

## 2016-12-15 NOTE — ED Notes (Signed)
Pt unable to ambulate due to pain

## 2016-12-15 NOTE — ED Triage Notes (Signed)
Lower back pain this am. 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

## 2016-12-15 NOTE — ED Notes (Signed)
Patient up and ambulatory with assistance of this RN. Able to ambulate on own with walker. The patient able to mover around better. The patient's Vitals WNL - Robaxin given as ordered and waiting for mother to come and pick her up

## 2016-12-15 NOTE — Discharge Instructions (Signed)
1. Medications: Naproxen 500 mg 2 times per day, Tylenol 1 g 4 times per day, Robaxin 500 mg 2 times per day, oxycodone 5 mg every 4 hours as needed for severe or breakthrough pain usual home medications 2. Treatment: rest, drink plenty of fluids, gentle stretching as discussed, alternate ice and heat 3. Follow Up: Please followup with your primary doctor in 3 days for discussion of your diagnoses and further evaluation after today's visit; if you do not have a primary care doctor use the resource guide provided to find one;  Return to the ER for worsening back pain, difficulty walking, loss of bowel or bladder control or other concerning symptoms

## 2017-06-28 ENCOUNTER — Emergency Department (HOSPITAL_BASED_OUTPATIENT_CLINIC_OR_DEPARTMENT_OTHER)
Admission: EM | Admit: 2017-06-28 | Discharge: 2017-06-29 | Disposition: A | Discharge status: Home / Self Care | Payer: Self-pay | Attending: Emergency Medicine | Admitting: Emergency Medicine

## 2017-06-28 ENCOUNTER — Encounter (HOSPITAL_BASED_OUTPATIENT_CLINIC_OR_DEPARTMENT_OTHER): Payer: Self-pay | Admitting: *Deleted

## 2017-06-28 DIAGNOSIS — K7689 Other specified diseases of liver: Secondary | ICD-10-CM | POA: Insufficient documentation

## 2017-06-28 DIAGNOSIS — R110 Nausea: Secondary | ICD-10-CM | POA: Insufficient documentation

## 2017-06-28 DIAGNOSIS — R1013 Epigastric pain: Secondary | ICD-10-CM | POA: Insufficient documentation

## 2017-06-28 DIAGNOSIS — K769 Liver disease, unspecified: Secondary | ICD-10-CM

## 2017-06-28 LAB — COMPREHENSIVE METABOLIC PANEL
ALT: 19 U/L (ref 14–54)
ANION GAP: 11 (ref 5–15)
AST: 16 U/L (ref 15–41)
Albumin: 4.6 g/dL (ref 3.5–5.0)
Alkaline Phosphatase: 60 U/L (ref 38–126)
BILIRUBIN TOTAL: 0.4 mg/dL (ref 0.3–1.2)
BUN: 16 mg/dL (ref 6–20)
CO2: 26 mmol/L (ref 22–32)
Calcium: 9.2 mg/dL (ref 8.9–10.3)
Chloride: 103 mmol/L (ref 101–111)
Creatinine, Ser: 0.6 mg/dL (ref 0.44–1.00)
GFR calc Af Amer: >60 mL/min (ref >60)
Glucose, Bld: 97 mg/dL (ref 65–99)
POTASSIUM: 3.4 mmol/L — AB (ref 3.5–5.1)
Sodium: 140 mmol/L (ref 135–145)
TOTAL PROTEIN: 8 g/dL (ref 6.5–8.1)

## 2017-06-28 LAB — CBC WITH DIFFERENTIAL/PLATELET
Basophils Absolute: 0 10*3/uL (ref 0.0–0.1)
Basophils Relative: 0 %
Eosinophils Absolute: 0.2 10*3/uL (ref 0.0–0.7)
Eosinophils Relative: 1 %
HCT: 41.1 % (ref 36.0–46.0)
Hemoglobin: 14.4 g/dL (ref 12.0–15.0)
Lymphocytes Relative: 21 %
Lymphs Abs: 2.7 10*3/uL (ref 0.7–4.0)
MCH: 28.4 pg (ref 26.0–34.0)
MCHC: 35 g/dL (ref 30.0–36.0)
MCV: 81.1 fL (ref 78.0–100.0)
Monocytes Absolute: 0.7 10*3/uL (ref 0.1–1.0)
Monocytes Relative: 5 %
Neutro Abs: 9.3 10*3/uL — ABNORMAL HIGH (ref 1.7–7.7)
Neutrophils Relative %: 73 %
Platelets: 302 10*3/uL (ref 150–400)
RBC: 5.07 MIL/uL (ref 3.87–5.11)
RDW: 13.2 % (ref 11.5–15.5)
WBC: 12.9 10*3/uL — ABNORMAL HIGH (ref 4.0–10.5)

## 2017-06-28 LAB — LIPASE, BLOOD: Lipase: 25 U/L (ref 11–51)

## 2017-06-28 MED ORDER — SODIUM CHLORIDE 0.9 % IV BOLUS (SEPSIS)
1000.0000 mL | Freq: Once | INTRAVENOUS | Status: AC
  Administered 2017-06-28: 1000 mL via INTRAVENOUS

## 2017-06-28 MED ORDER — ONDANSETRON HCL 4 MG/2ML IJ SOLN
4.0000 mg | Freq: Once | INTRAMUSCULAR | Status: AC | PRN
  Administered 2017-06-28: 4 mg via INTRAVENOUS
  Filled 2017-06-28: qty 2

## 2017-06-28 NOTE — ED Triage Notes (Signed)
Pt presents with "hard knot just above umbilicas x 3 days decreased appetite and N/V pain begins after eating denies fever

## 2017-06-28 NOTE — ED Notes (Addendum)
Patient has been vomiting for the past 3 days 2 hours after she eat.  Vomitus is undigested food.  Patient took Dulcolax 2 tabs this afternoon thinking that she is constipated but she stated that she has been having BM everyday in the past 3 days.   She wants to cover the basics first before coming to the ED per patient. Patient stated that she felt a knot in the mid upper region of her abdomen.

## 2017-06-29 ENCOUNTER — Emergency Department (HOSPITAL_COMMUNITY): Payer: Self-pay

## 2017-06-29 ENCOUNTER — Emergency Department (HOSPITAL_BASED_OUTPATIENT_CLINIC_OR_DEPARTMENT_OTHER): Payer: Self-pay

## 2017-06-29 LAB — HCG, SERUM, QUALITATIVE: PREG SERUM: NEGATIVE

## 2017-06-29 MED ORDER — IOPAMIDOL (ISOVUE-300) INJECTION 61%
100.0000 mL | Freq: Once | INTRAVENOUS | Status: AC | PRN
  Administered 2017-06-29: 100 mL via INTRAVENOUS

## 2017-06-29 MED ORDER — GI COCKTAIL ~~LOC~~
30.0000 mL | Freq: Once | ORAL | Status: AC
  Administered 2017-06-29: 30 mL via ORAL
  Filled 2017-06-29 (×2): qty 30

## 2017-06-29 NOTE — ED Provider Notes (Signed)
[facility]-EMERGENCY DEPT Provider Note   CSN: [id number] Arrival date & time: 06/28/17  2139     History   Chief Complaint Chief Complaint  Patient presents with  . Abdominal Pain    HPI Carla Morris is a 36 y.o. female w PMHx of PCOS, Presents with epigastric abdominal pain and vomiting 3 days. Patient states epigastric pain occurs after eating, followed by vomiting at least one of her meals per day. She states pain is sharp and intense. Vomit is nonbilious and nonbloody. Reports normal bowel movements with last one today. She also reports associated decrease in appetite. No medications tried prior to visit. Denies vaginal bleeding or discharge, fever, chills, chest pain, shortness of breath, blood in stool, urinary symptoms, or any other symptoms today. The history is provided by the patient.    Past Medical History:  Diagnosis Date  . Pars defect     Patient Active Problem List   Diagnosis Date Noted  . PCOS (polycystic ovarian syndrome) 10/24/2013    History reviewed. No pertinent surgical history.  OB History    No data available       Home Medications    Prior to Admission medications   Not on File    Family History History reviewed. No pertinent family history.  Social History Social History  Substance Use Topics  . Smoking status: Never Smoker  . Smokeless tobacco: Never Used  . Alcohol use No     Allergies   Aspirin   Review of Systems Review of Systems  Constitutional: Positive for appetite change. Negative for chills and fever.  HENT: Negative for trouble swallowing.   Respiratory: Negative for shortness of breath.   Cardiovascular: Negative for chest pain.  Gastrointestinal: Positive for abdominal pain, nausea and vomiting. Negative for blood in stool, constipation and diarrhea.  Genitourinary: Negative for dysuria, frequency, vaginal bleeding and vaginal discharge.  Musculoskeletal: Negative.   Skin: Negative for color change.    Allergic/Immunologic: Negative for immunocompromised state.  Neurological: Negative.      Physical Exam Updated Vital Signs BP 129/83 (BP Location: Left Arm)   Pulse 95   Temp 98.4 F (36.9 C) (Oral)   Resp 18   Ht 5\' 5"  (1.651 m)   Wt 90.7 kg (200 lb)   LMP 06/07/2017   SpO2 96%   BMI 33.28 kg/m   Physical Exam  Constitutional: She appears well-developed and well-nourished. No distress.  Well-appearing, not in distress  HENT:  Head: Normocephalic and atraumatic.  Eyes: Conjunctivae are normal.  Cardiovascular: Normal rate, regular rhythm, normal heart sounds and intact distal pulses.  Exam reveals no friction rub.   No murmur heard. Pulmonary/Chest: Effort normal and breath sounds normal. No respiratory distress. She has no wheezes. She has no rales.  Abdominal: Soft. Bowel sounds are normal. She exhibits no distension and no mass. There is tenderness (eipgastric and LUQ). There is no rigidity, no rebound, no guarding, no CVA tenderness and negative Murphy's sign. No hernia.  Musculoskeletal: Normal range of motion.  Neurological: She is alert.  Skin: Skin is warm.  Psychiatric: She has a normal mood and affect. Her behavior is normal.  Nursing note and vitals reviewed.    ED Treatments / Results  Labs (all labs ordered are listed, but only abnormal results are displayed) Labs Reviewed  COMPREHENSIVE METABOLIC PANEL - Abnormal; Notable for the following:       Result Value   Potassium 3.4 (*)    All other components within normal  limits  CBC WITH DIFFERENTIAL/PLATELET - Abnormal; Notable for the following:    WBC 12.9 (*)    Neutro Abs 9.3 (*)    All other components within normal limits  LIPASE, BLOOD  HCG, SERUM, QUALITATIVE    EKG  EKG Interpretation None       Radiology Ct Abdomen Pelvis W Contrast  Result Date: 06/29/2017 CLINICAL DATA:  Knot just above umbilicus. EXAM: CT ABDOMEN AND PELVIS WITH CONTRAST TECHNIQUE: Multidetector CT imaging of  the abdomen and pelvis was performed using the standard protocol following bolus administration of intravenous contrast. CONTRAST:  ISOVUE-300 IOPAMIDOL (ISOVUE-300) INJECTION 61% COMPARISON:  None. FINDINGS: Lower chest: No acute abnormality. Hepatobiliary: Nonspecific hypodensity in the right hepatic lobe measuring approximately 4.9 x 4 x 3.3 cm. Differential possibilities may include a cavernous hemangioma, focal nodular hyperplasia, adenoma or potentially an abscess among some possibilities though not exclusive. There is slight bulging of the joint capsule due to this lesion. Ultrasound or CT/ MRI with liver lesion protocol without and with contrast may help for better characterization. No biliary dilatation. The gallbladder is free of stones. Pancreas: Normal Spleen: Normal Adrenals/Urinary Tract: Normal Stomach/Bowel: Contrast distention of the stomach with normal small bowel rotation. No bowel obstruction or inflammation. Normal appendix. No acute large bowel abnormality is apparent. Vascular/Lymphatic: No significant vascular findings are present. No enlarged abdominal or pelvic lymph nodes. Reproductive: Small nabothian cysts are noted at the cervix. No uterine mass or adnexal abnormality. Other: No free air nor free fluid. Small umbilical fat containing hernia. Musculoskeletal: Small central disc herniation L4-5. No acute nor suspicious osseous abnormalities. IMPRESSION: 1. Hypodense mass in the right hepatic lobe slightly bulging the liver surface measuring 4.9 x 4 x 3.3 cm. Findings are nonspecific. Differential considerations might include a hemangioma, focal nodular hyperplasia, adenoma or possibly abscess some possibilities though not exclusive. Non emergent cross-sectional imaging with liver tumor protocol and/or ultrasound may help for further characterization. 2. Small fat containing umbilical hernia without evidence of incarceration. 3. Mild central disc herniation L4-5. 4. No acute bowel  inflammation or obstruction. Electronically Signed   By: Tollie Eth M.D.   On: 06/29/2017 02:08    Procedures Procedures (including critical care time)  Medications Ordered in ED Medications  ondansetron [facility]) injection 4 mg (4 mg Intravenous Given 06/28/17 2224)  sodium chloride 0.9 % bolus 1,000 mL (0 mLs Intravenous Stopped 06/29/17 0139)  gi cocktail (Maalox,Lidocaine,Donnatal) (30 mLs Oral Given 06/29/17 0137)  iopamidol (ISOVUE-300) 61 % injection 100 mL (100 mLs Intravenous Contrast Given 06/29/17 0131)     Initial Impression / Assessment and Plan / ED Course  I have reviewed the triage vital signs and the nursing notes.  Pertinent labs & imaging results that were available during my care of the patient were reviewed by me and considered in my medical decision making (see chart for details).     Pt w epigastric abdominal pain and N/V. Patient is nontoxic, nonseptic appearing, in no apparent distress.  Patient's pain and other symptoms adequately managed in emergency department.  Fluid bolus given.  Labs, imaging and vitals reviewed.  CT abd showing hypodense mass in the right hepatic lobe, no acute abdominal pathology. Patient does not meet the SIRS or Sepsis criteria.  On repeat exam patient does not have a surgical abdomen and there are no peritoneal signs.  No indication of appendicitis, bowel obstruction, bowel perforation, cholecystitis, diverticulitis, PID or ectopic pregnancy. CT findings and recommendation for MRI discussed with patient by  Dr. Nicanor Alcon. Patient discharged home with symptomatic treatment for gastritis and given strict instructions for follow-up with their primary care physician about visit and CT findings.  Pt safe for discharge.  Patient discussed with and seen by Dr. Madilyn Hook, who agrees with care plan.  Discussed results, findings, treatment and follow up. Patient advised of return precautions. Patient verbalized understanding and agreed with plan.  Final  Clinical Impressions(s) / ED Diagnoses   Final diagnoses:  Epigastric abdominal pain  Nausea  Liver lesion    New Prescriptions There are no discharge medications for this patient.    Russo, [place] N, PA-C 06/29/17 1535    Tilden Fossa, MD 06/30/17 [phone]

## 2017-06-29 NOTE — Discharge Instructions (Signed)
Please read instructions below. Begin taking omeprazole 2 times per day for 1 week.  Schedule an appointment with your primary care provider to follow up on your visit today. Return to the ER for worsening pain, fever, if you stop having bowel movements, or new or concerning symptoms.

## 2017-06-29 NOTE — ED Notes (Signed)
Patient transported to CT 

## 2017-06-29 NOTE — ED Notes (Signed)
Pt verbalizes understanding of d/c instructions and denies any further needs at this time. 

## 2018-08-26 IMAGING — CT CT ABD-PELV W/ CM
2 of 4 series · 15 of 46 positions shown, 17 images · IV contrast (APPLIED)
Comparison: None.

CLINICAL DATA: Knot just above umbilicus.

EXAM:
CT ABDOMEN AND PELVIS WITH CONTRAST
TECHNIQUE: Multidetector CT imaging of the abdomen and pelvis was performed
using the standard protocol following bolus administration of
intravenous contrast.
CONTRAST:  100mL PRJXRY-FUU IOPAMIDOL (PRJXRY-FUU) INJECTION 61%

[Series 2: axial st · axial · 0.86mm/px · z∈[-470,-10]mm · 12 of 102 slices shown, 14 images]
[im 5/102  soft-tissue]
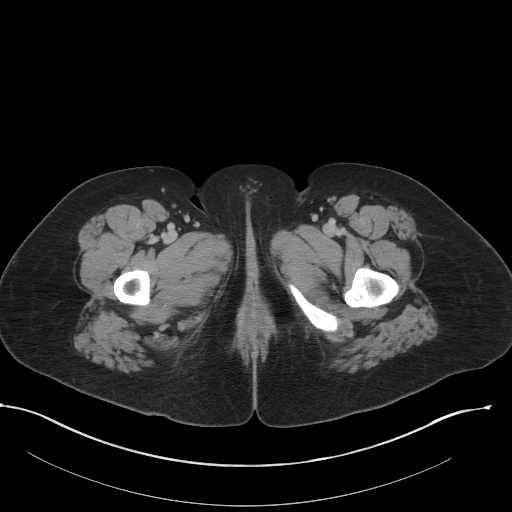
[im 5/102  bone]
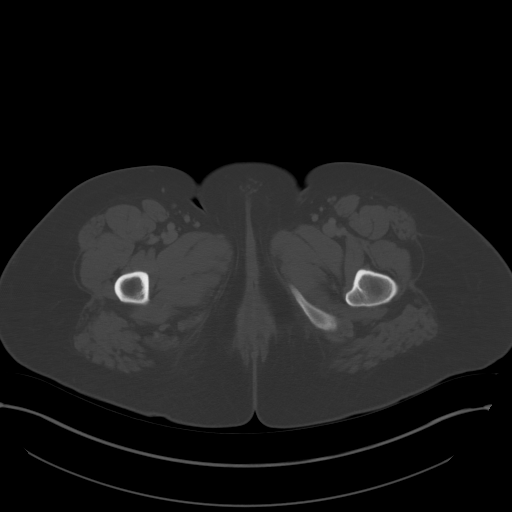
[im 13/102  soft-tissue]
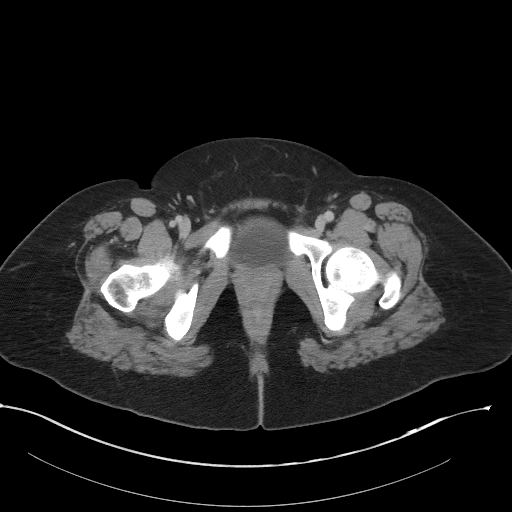
[im 22/102  soft-tissue]
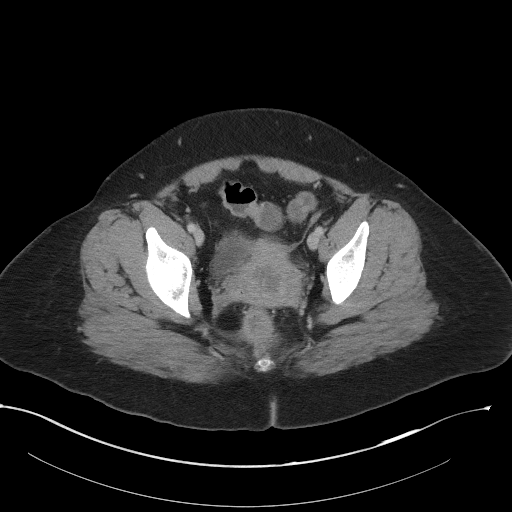
[im 30/102  soft-tissue]
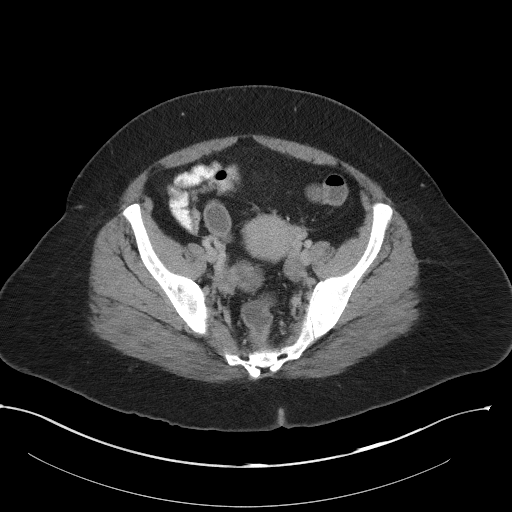
[im 38/102  soft-tissue]
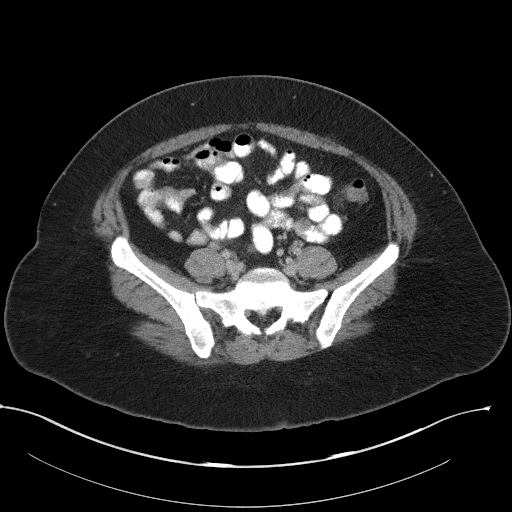
[im 47/102  soft-tissue]
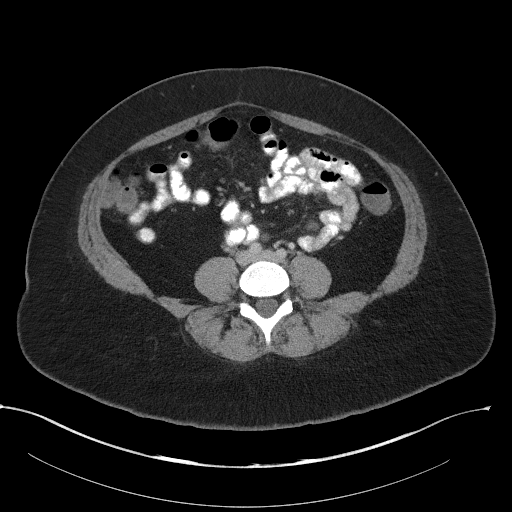
[im 55/102  soft-tissue]
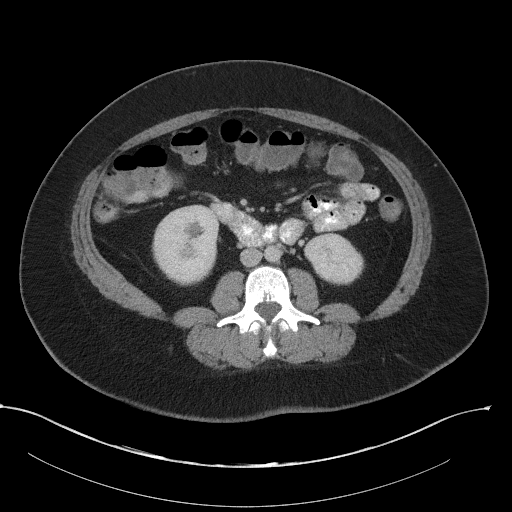
[im 64/102  soft-tissue]
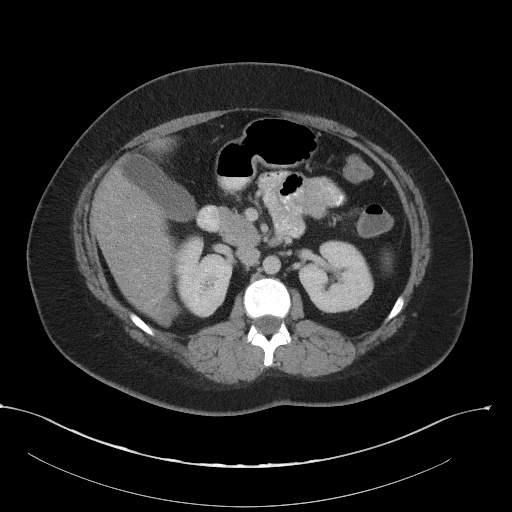
[im 72/102  soft-tissue]
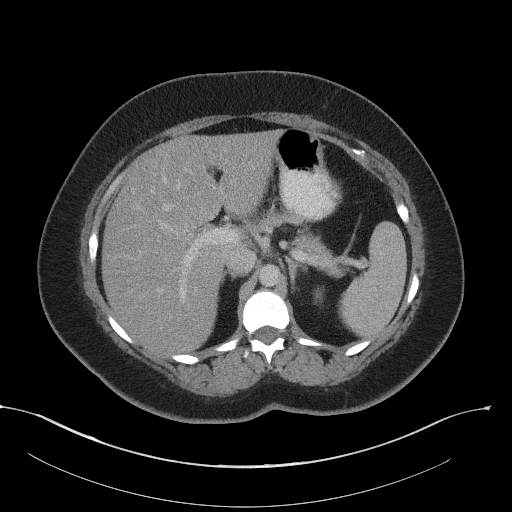
[im 72/102  bone]
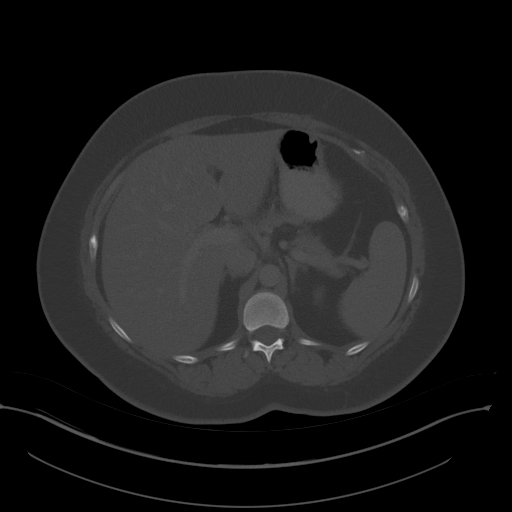
[im 80/102  soft-tissue]
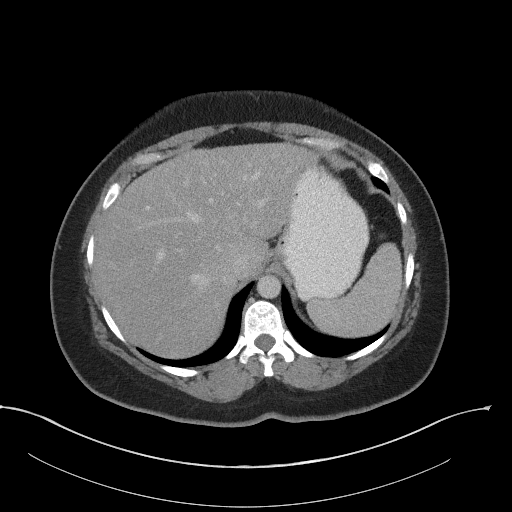
[im 89/102  soft-tissue]
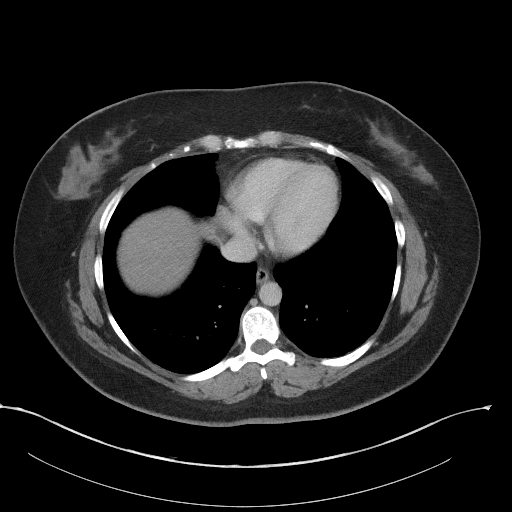
[im 97/102  soft-tissue]
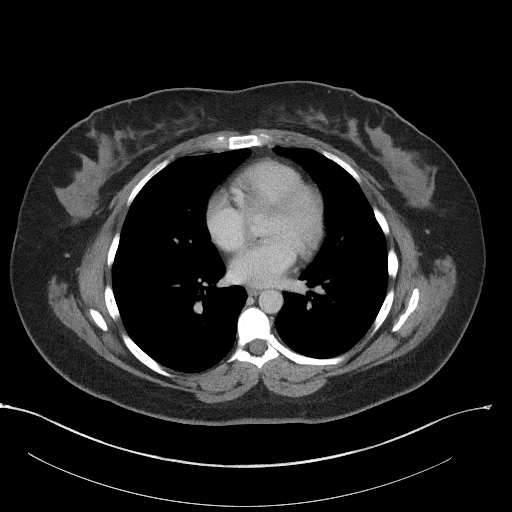

[Series 5: coronal st · coronal · 0.70mm/px · 3 of 95 slices shown]
[im 32/95  soft-tissue]
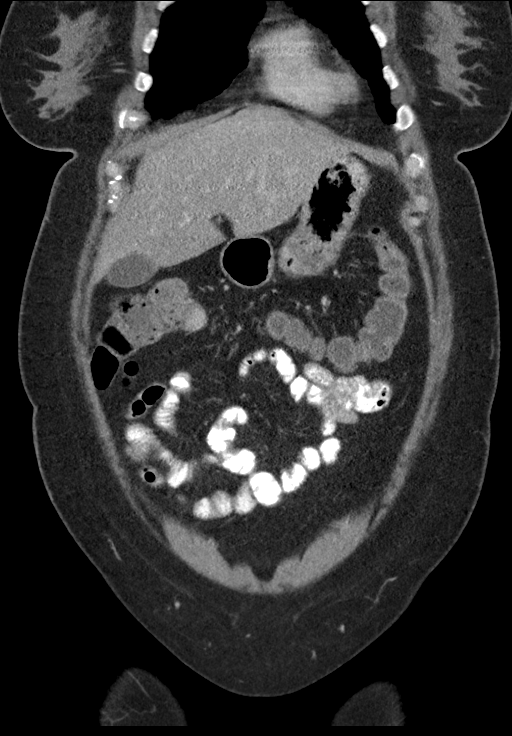
[im 42/95  soft-tissue]
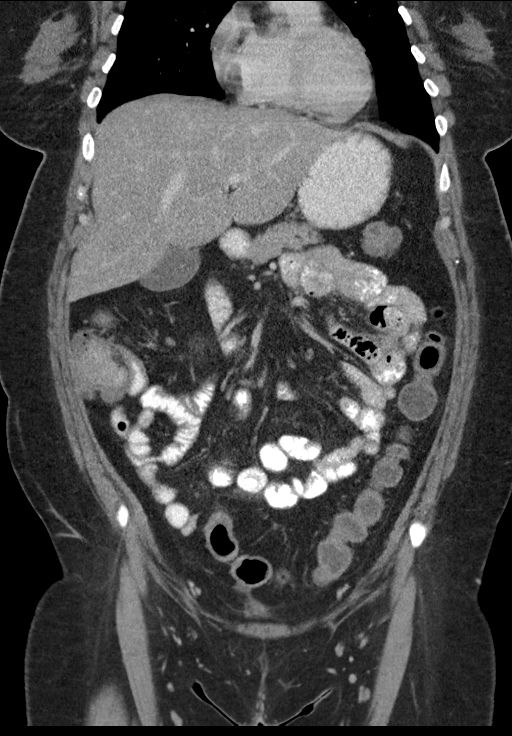
[im 53/95  soft-tissue]
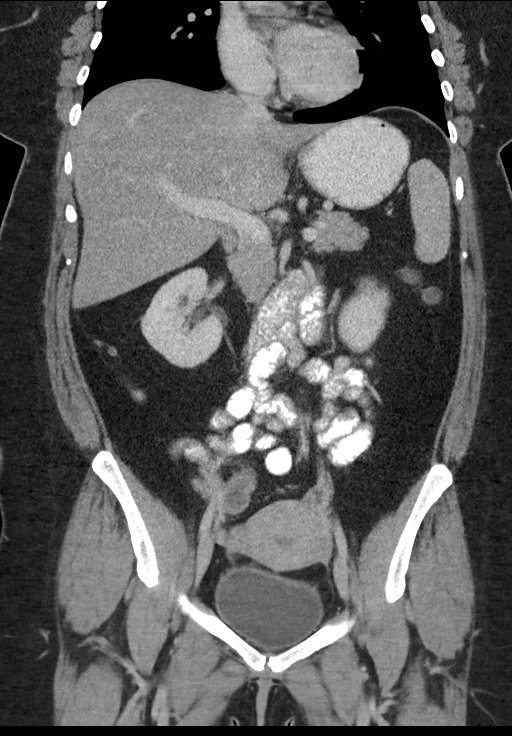

[15 of 46 positions shown; findings below may reference images not displayed]

FINDINGS: Lower chest: No acute abnormality.

Hepatobiliary: Nonspecific hypodensity in the right hepatic lobe
measuring approximately 4.9 x 4 x 3.3 cm. Differential possibilities
may include a cavernous hemangioma, focal nodular hyperplasia,
adenoma or potentially an abscess among some possibilities though
not exclusive. There is slight bulging of the joint capsule due to
this lesion. Ultrasound or CT/ MRI with liver lesion protocol
without and with contrast may help for better characterization. No
biliary dilatation. The gallbladder is free of stones.

Pancreas: Normal

Spleen: Normal

Adrenals/Urinary Tract: Normal

Stomach/Bowel: Contrast distention of the stomach with normal small
bowel rotation. No bowel obstruction or inflammation. Normal
appendix. No acute large bowel abnormality is apparent.

Vascular/Lymphatic: No significant vascular findings are present. No
enlarged abdominal or pelvic lymph nodes.

Reproductive: Small nabothian cysts are noted at the cervix. No
uterine mass or adnexal abnormality.

Other: No free air nor free fluid. Small umbilical fat containing
hernia.

Musculoskeletal: Small central disc herniation L4-5. No acute nor
suspicious osseous abnormalities.
IMPRESSION: 1. Hypodense mass in the right hepatic lobe slightly bulging the
liver surface measuring 4.9 x 4 x 3.3 cm. Findings are nonspecific.
Differential considerations might include a hemangioma, focal
nodular hyperplasia, adenoma or possibly abscess some possibilities
though not exclusive. Non emergent cross-sectional imaging with
liver tumor protocol and/or ultrasound may help for further
characterization.
2. Small fat containing umbilical hernia without evidence of
incarceration.
3. Mild central disc herniation L4-5.
4. No acute bowel inflammation or obstruction.

## 2020-08-17 MED FILL — FLUARIX QUADRIVALENT 0.5 ML: 0.5 | 1 days supply | Qty: 1 | Fill #0

## 2021-01-03 ENCOUNTER — Ambulatory Visit: Payer: Self-pay | Admitting: Sports Medicine

## 2021-01-06 ENCOUNTER — Other Ambulatory Visit (HOSPITAL_BASED_OUTPATIENT_CLINIC_OR_DEPARTMENT_OTHER): Payer: Self-pay | Admitting: Sports Medicine

## 2021-01-06 ENCOUNTER — Other Ambulatory Visit: Payer: Self-pay

## 2021-01-06 ENCOUNTER — Encounter: Payer: Self-pay | Admitting: Sports Medicine

## 2021-01-06 ENCOUNTER — Ambulatory Visit (INDEPENDENT_AMBULATORY_CARE_PROVIDER_SITE_OTHER): Payer: 59 | Admitting: Sports Medicine

## 2021-01-06 DIAGNOSIS — E669 Obesity, unspecified: Secondary | ICD-10-CM | POA: Insufficient documentation

## 2021-01-06 DIAGNOSIS — Z0000 Encounter for general adult medical examination without abnormal findings: Secondary | ICD-10-CM | POA: Diagnosis not present

## 2021-01-06 DIAGNOSIS — E6609 Other obesity due to excess calories: Secondary | ICD-10-CM

## 2021-01-06 DIAGNOSIS — E282 Polycystic ovarian syndrome: Secondary | ICD-10-CM | POA: Diagnosis not present

## 2021-01-06 DIAGNOSIS — F411 Generalized anxiety disorder: Secondary | ICD-10-CM | POA: Diagnosis not present

## 2021-01-06 DIAGNOSIS — D509 Iron deficiency anemia, unspecified: Secondary | ICD-10-CM

## 2021-01-06 LAB — CBC: MPV: 10.6 fL (ref 7.5–12.5)

## 2021-01-06 MED ORDER — SERTRALINE HCL 25 MG PO TABS: 25.0000 mg | ORAL_TABLET | Freq: Every day | ORAL | 2 refills | Status: DC

## 2021-01-06 MED ORDER — PHENTERMINE HCL 37.5 MG PO TABS: ORAL_TABLET | ORAL | 0 refills | Status: DC

## 2021-01-06 MED ORDER — ALPRAZOLAM 0.25 MG PO TABS: 0.2500 mg | ORAL_TABLET | Freq: Two times a day (BID) | ORAL | 0 refills | Status: DC | PRN

## 2021-01-06 MED FILL — ALPRAZolam 0.25 MG TABS: 0.25 | 10 days supply | Qty: 20 | Fill #0

## 2021-01-06 MED FILL — PHENTERMINE 37.5 MG TABLET: 37.5 | 30 days supply | Qty: 30 | Fill #0

## 2021-01-06 MED FILL — SERTRALINE HCL 25 MG TABLET: 25 | 30 days supply | Qty: 30 | Fill #0

## 2021-01-06 NOTE — Assessment & Plan Note (Signed)
Motor generalized anxiety with panic, adding sertraline 25 mg daily, failed Lexapro in the past, she will also do 0.25 mg alprazolam as needed. Return to see me in 6 weeks, we can we can recheck PHQ and gad.

## 2021-01-06 NOTE — Assessment & Plan Note (Signed)
Unclear whether or not she has true PCOS, declines Metformin due to initial GI upset, I did explain that extended release Metformin would be unlikely to causes GI upset but we will hold off on Metformin treatment for now, periods are regular, does not have any hirsutism, we are going to check her testosterone levels. In addition she has had pelvic and transvaginal ultrasonography without evidence of polycystic changes of her ovaries.

## 2021-01-06 NOTE — Assessment & Plan Note (Signed)
Referral to [facility] due for cervical cancer screening. Adding some routine labs. Up-to-date on other screening measures.

## 2021-01-06 NOTE — Progress Notes (Addendum)
    Procedures performed today:    None.  Independent interpretation of notes and tests performed by another provider:   None.  Brief History, Exam, Impression, and Recommendations:    Generalized anxiety disorder Motor generalized anxiety with panic, adding sertraline 25 mg daily, failed Lexapro in the past, she will also do 0.25 mg alprazolam as needed. Return to see me in 6 weeks, we can we can recheck PHQ and gad.   Annual physical exam Referral to [facility] due for cervical cancer screening. Adding some routine labs. Up-to-date on other screening measures.  Obesity Starting phentermine, exercise prescription given, return monthly for weight checks and refills.  PCOS (polycystic ovarian syndrome) Unclear whether or not she has true PCOS, declines Metformin due to initial GI upset, I did explain that extended release Metformin would be unlikely to causes GI upset but we will hold off on Metformin treatment for now, periods are regular, does not have any hirsutism, we are going to check her testosterone levels. In addition she has had pelvic and transvaginal ultrasonography without evidence of polycystic changes of her ovaries.  Microcytic anemia Deficiency in a menstruating female, adding iron supplementation, ferrous sulfate 325 2-3 times a day.    ___________________________________________ Ihor Austin. Benjamin Stain, M.D., ABFM., CAQSM. Primary Care and Sports Medicine [facility]  Adjunct Instructor of Family Medicine  [employer]

## 2021-01-06 NOTE — Assessment & Plan Note (Signed)
Starting phentermine, exercise prescription given, return monthly for weight checks and refills.

## 2021-01-07 ENCOUNTER — Other Ambulatory Visit (HOSPITAL_BASED_OUTPATIENT_CLINIC_OR_DEPARTMENT_OTHER): Payer: Self-pay | Admitting: Sports Medicine

## 2021-01-07 DIAGNOSIS — D509 Iron deficiency anemia, unspecified: Secondary | ICD-10-CM | POA: Insufficient documentation

## 2021-01-07 LAB — HEMOGLOBIN A1C
Hgb A1c MFr Bld: 5.6 % of total Hgb (ref <5.7)
Mean Plasma Glucose: 114 mg/dL
eAG (mmol/L): 6.3 mmol/L

## 2021-01-07 LAB — COMPREHENSIVE METABOLIC PANEL
AG Ratio: 1.6 (calc) (ref 1.0–2.5)
ALT: 15 U/L (ref 6–29)
AST: 12 U/L (ref 10–30)
Albumin: 4.4 g/dL (ref 3.6–5.1)
Alkaline phosphatase (APISO): 56 U/L (ref 31–125)
BUN: 11 mg/dL (ref 7–25)
CO2: 29 mmol/L (ref 20–32)
Calcium: 9.4 mg/dL (ref 8.6–10.2)
Chloride: 103 mmol/L (ref 98–110)
Creat: 0.57 mg/dL (ref 0.50–1.10)
Globulin: 2.8 g/dL (calc) (ref 1.9–3.7)
Glucose, Bld: 90 mg/dL (ref 65–99)
Potassium: 5.2 mmol/L (ref 3.5–5.3)
Sodium: 138 mmol/L (ref 135–146)
Total Bilirubin: 0.3 mg/dL (ref 0.2–1.2)
Total Protein: 7.2 g/dL (ref 6.1–8.1)

## 2021-01-07 LAB — TSH: TSH: 1.38 mIU/L

## 2021-01-07 LAB — CBC
HCT: 35.5 % (ref 35.0–45.0)
Hemoglobin: 11.5 g/dL — ABNORMAL LOW (ref 11.7–15.5)
MCH: 23.9 pg — ABNORMAL LOW (ref 27.0–33.0)
MCHC: 32.4 g/dL (ref 32.0–36.0)
MCV: 73.8 fL — ABNORMAL LOW (ref 80.0–100.0)
Platelets: 351 Thousand/uL (ref 140–400)
RBC: 4.81 10*6/uL (ref 3.80–5.10)
RDW: 14 % (ref 11.0–15.0)
WBC: 7.8 10*3/uL (ref 3.8–10.8)

## 2021-01-07 MED ORDER — FERROUS SULFATE 325 (65 FE) MG PO TBEC: DELAYED_RELEASE_TABLET | ORAL | 11 refills | Status: DC

## 2021-01-07 NOTE — Assessment & Plan Note (Signed)
Deficiency in a menstruating female, adding iron supplementation, ferrous sulfate 325 2-3 times a day.

## 2021-01-07 NOTE — Addendum Note (Signed)
Addended by: Monica Becton on: 01/07/2021 09:57 AM   Modules accepted: Orders

## 2021-02-03 ENCOUNTER — Encounter: Payer: Self-pay | Admitting: Medical-Surgical

## 2021-02-03 ENCOUNTER — Other Ambulatory Visit (HOSPITAL_BASED_OUTPATIENT_CLINIC_OR_DEPARTMENT_OTHER): Payer: Self-pay | Admitting: Sports Medicine

## 2021-02-03 ENCOUNTER — Encounter: Payer: Self-pay | Admitting: Sports Medicine

## 2021-02-03 ENCOUNTER — Other Ambulatory Visit: Payer: Self-pay

## 2021-02-03 ENCOUNTER — Other Ambulatory Visit (HOSPITAL_COMMUNITY)
Admission: RE | Admit: 2021-02-03 | Discharge: 2021-02-03 | Disposition: A | Discharge status: Home / Self Care | Payer: 59 | Source: Ambulatory Visit | Attending: Medical-Surgical | Admitting: Medical-Surgical

## 2021-02-03 ENCOUNTER — Ambulatory Visit: Payer: 59 | Admitting: Family Medicine

## 2021-02-03 ENCOUNTER — Ambulatory Visit (INDEPENDENT_AMBULATORY_CARE_PROVIDER_SITE_OTHER): Payer: 59

## 2021-02-03 ENCOUNTER — Ambulatory Visit: Payer: 59 | Admitting: Sports Medicine

## 2021-02-03 ENCOUNTER — Ambulatory Visit: Payer: 59 | Admitting: Medical-Surgical

## 2021-02-03 VITALS — BP 104/71 | HR 92 | Ht 65.0 in | Wt 208.0 lb

## 2021-02-03 DIAGNOSIS — G8929 Other chronic pain: Secondary | ICD-10-CM | POA: Diagnosis not present

## 2021-02-03 DIAGNOSIS — F32A Depression, unspecified: Secondary | ICD-10-CM

## 2021-02-03 DIAGNOSIS — F419 Anxiety disorder, unspecified: Secondary | ICD-10-CM | POA: Diagnosis not present

## 2021-02-03 DIAGNOSIS — R87612 Low grade squamous intraepithelial lesion on cytologic smear of cervix (LGSIL): Secondary | ICD-10-CM | POA: Diagnosis not present

## 2021-02-03 DIAGNOSIS — E6609 Other obesity due to excess calories: Secondary | ICD-10-CM | POA: Diagnosis not present

## 2021-02-03 DIAGNOSIS — E282 Polycystic ovarian syndrome: Secondary | ICD-10-CM | POA: Diagnosis not present

## 2021-02-03 DIAGNOSIS — M545 Low back pain, unspecified: Secondary | ICD-10-CM | POA: Diagnosis not present

## 2021-02-03 DIAGNOSIS — F411 Generalized anxiety disorder: Secondary | ICD-10-CM

## 2021-02-03 DIAGNOSIS — M5442 Lumbago with sciatica, left side: Secondary | ICD-10-CM

## 2021-02-03 DIAGNOSIS — Z124 Encounter for screening for malignant neoplasm of cervix: Secondary | ICD-10-CM

## 2021-02-03 MED ORDER — ALPRAZOLAM 0.5 MG PO TABS: 0.5000 mg | ORAL_TABLET | Freq: Two times a day (BID) | ORAL | 0 refills | Status: DC | PRN

## 2021-02-03 MED ORDER — METFORMIN HCL ER 500 MG PO TB24: 500.0000 mg | ORAL_TABLET | Freq: Every day | ORAL | 3 refills | Status: DC

## 2021-02-03 MED ORDER — NAPROXEN 500 MG PO TABS: 500.0000 mg | ORAL_TABLET | Freq: Two times a day (BID) | ORAL | 3 refills | Status: AC

## 2021-02-03 MED ORDER — PHENTERMINE HCL 37.5 MG PO TABS: ORAL_TABLET | ORAL | 0 refills | Status: DC

## 2021-02-03 MED ORDER — METHOCARBAMOL 500 MG PO TABS: 500.0000 mg | ORAL_TABLET | Freq: Three times a day (TID) | ORAL | 0 refills | Status: DC

## 2021-02-03 NOTE — Assessment & Plan Note (Signed)
7 weight loss after the first month and has only been doing half dose phentermine. Refilling phentermine. Return to see me in a month, entering the second month. She does have the option to do a full tab.

## 2021-02-03 NOTE — Assessment & Plan Note (Signed)
History of a pars defect, occasional low back pain with left-sided radiculitis. Historically she has done well with methocarbamol and naproxen, I will add this as well as flexion/extension view lumbar spine x-rays, and home rehab exercises.

## 2021-02-03 NOTE — Progress Notes (Signed)
    Procedures performed today:    None.  Independent interpretation of notes and tests performed by another provider:   None.  Brief History, Exam, Impression, and Recommendations:    Polycystic ovarian disease On further questioning she did have a high testosterone level in the past, as well as mild hirsutism on the face. Periods are regular. Ultrasound in the past have been negative for evidence of polycystic changes. Due to high testosterone and hirsutism we are going to diagnose her with PCOS, will try low-dose extended release Metformin. Combination oral contraceptives have been ineffective and resulted in excessive and prolonged breakthrough bleeding for a months.  Obesity 7 weight loss after the first month and has only been doing half dose phentermine. Refilling phentermine. Return to see me in a month, entering the second month. She does have the option to do a full tab.  Anxiety and depression Generalized anxiety with occasional panic, we added sertraline 25 as she did fail Lexapro in the past, she felt excessively flat on Lexapro since self discontinued it, she does well with an occasional alprazolam, and her anxiety has decreased significantly so we will hold off on preventative treatment/controlled treatment with an SSRI and just to occasional alprazolam, increasing to 0.5 mg per her request.  Chronic low back pain History of a pars defect, occasional low back pain with left-sided radiculitis. Historically she has done well with methocarbamol and naproxen, I will add this as well as flexion/extension view lumbar spine x-rays, and home rehab exercises.    ___________________________________________ Ihor Austin. Benjamin Stain, M.D., ABFM., CAQSM. Primary Care and Sports Medicine [facility]  Adjunct Instructor of Family Medicine  [employer]

## 2021-02-03 NOTE — Assessment & Plan Note (Signed)
Generalized anxiety with occasional panic, we added sertraline 25 as she did fail Lexapro in the past, she felt excessively flat on Lexapro since self discontinued it, she does well with an occasional alprazolam, and her anxiety has decreased significantly so we will hold off on preventative treatment/controlled treatment with an SSRI and just to occasional alprazolam, increasing to 0.5 mg per her request.

## 2021-02-03 NOTE — Progress Notes (Signed)
  Subjective:    CC: pap smear  HPI: Pleasant 40 year old female presenting for cervical cancer screening today. She had her CPE with Dr. Benjamin Stain but preferred a female provider for her pap smear. She is sexually active with one female partner. No concerns for STIs or vaginal/urinary symptoms today. No tampon or sexual activity in the last 48 hours.   I reviewed the past medical history, family history, social history, surgical history, and allergies today and no changes were needed.  Please see the problem list section below in epic for further details.  Past Medical History: Past Medical History:  Diagnosis Date  . Anxiety   . Asthma   . Pars defect    Past Surgical History: Past Surgical History:  Procedure Laterality Date  . NO PAST SURGERIES     Social History: Social History   Socioeconomic History  . Marital status: Significant Other    Spouse name: Not on file  . Number of children: Not on file  . Years of education: Not on file  . Highest education level: Not on file  Occupational History  . Not on file  Tobacco Use  . Smoking status: Never Smoker  . Smokeless tobacco: Never Used  Vaping Use  . Vaping Use: Never used  Substance and Sexual Activity  . Alcohol use: No  . Drug use: No  . Sexual activity: Never  Other Topics Concern  . Not on file  Social History Narrative   Full time student/plan to enter PA Program/currently [employer]   1 son: 64 yrs old   Regular exercise: some   Caffeine use: 1 cup of coffee daily   Social Determinants of Health   Financial Resource Strain: Not on file  Food Insecurity: Not on file  Transportation Needs: Not on file  Physical Activity: Not on file  Stress: Not on file  Social Connections: Not on file   Family History: History reviewed. No pertinent family history. Allergies: Allergies  Allergen Reactions  . Aspirin Other (See Comments)    Severe migraines    Medications: See med rec.  Review of Systems: See  HPI for pertinent positives and negatives.   Objective:    General: Well Developed, well nourished, and in no acute distress.  Neuro: Alert and oriented x3.  HEENT: Normocephalic, atraumatic.  Skin: Warm and dry. Cardiac: Regular rate and rhythm, no murmurs rubs or gallops, no lower extremity edema.  Respiratory: Clear to auscultation bilaterally. Not using accessory muscles, speaking in full sentences. Pelvic exam: normal external genitalia, vulva, vagina, cervix, uterus and adnexa, PAP: Pap smear done today with HPV cotesting, exam chaperoned by Ihor Gully, MA.  Impression and Recommendations:    1. Screening for cervical cancer Pap smear with HPV cotesting completed. No STI testing or concerns today.  - Cytology - PAP  Return if symptoms worsen or fail to improve.  ___________________________________________ Thayer Ohm, DNP, APRN, FNP-BC Primary Care and Sports Medicine [facility] [place]

## 2021-02-03 NOTE — Assessment & Plan Note (Signed)
On further questioning she did have a high testosterone level in the past, as well as mild hirsutism on the face. Periods are regular. Ultrasound in the past have been negative for evidence of polycystic changes. Due to high testosterone and hirsutism we are going to diagnose her with PCOS, will try low-dose extended release Metformin. Combination oral contraceptives have been ineffective and resulted in excessive and prolonged breakthrough bleeding for a months.

## 2021-02-09 ENCOUNTER — Encounter: Payer: Self-pay | Admitting: Medical-Surgical

## 2021-02-09 DIAGNOSIS — R87612 Low grade squamous intraepithelial lesion on cytologic smear of cervix (LGSIL): Secondary | ICD-10-CM

## 2021-02-09 LAB — CYTOLOGY - PAP
Adequacy: ABSENT
Comment: NEGATIVE
Comment: NEGATIVE
HPV 16: NEGATIVE
HPV 18 / 45: NEGATIVE
High risk HPV: POSITIVE — AB

## 2021-03-16 ENCOUNTER — Ambulatory Visit: Payer: 59 | Admitting: Sports Medicine

## 2021-04-01 ENCOUNTER — Telehealth: Payer: 59 | Admitting: Emergency Medicine

## 2021-04-01 ENCOUNTER — Other Ambulatory Visit (HOSPITAL_BASED_OUTPATIENT_CLINIC_OR_DEPARTMENT_OTHER): Payer: Self-pay

## 2021-04-01 DIAGNOSIS — J069 Acute upper respiratory infection, unspecified: Secondary | ICD-10-CM

## 2021-04-01 MED ORDER — IPRATROPIUM BROMIDE 0.03 % NA SOLN
NASAL | 0 refills | Status: DC
  Filled 2021-04-01: qty 30, 29d supply, fill #0

## 2021-04-01 MED ORDER — BENZONATATE 100 MG PO CAPS
100.0000 mg | ORAL_CAPSULE | Freq: Three times a day (TID) | ORAL | 0 refills | Status: DC | PRN
  Filled 2021-04-01: qty 20, 4d supply, fill #0

## 2021-04-01 NOTE — Progress Notes (Signed)
We are sorry you are not feeling well.  Here is how we plan to help!  Based on what you have shared with me, it looks like you may have a viral upper respiratory infection.  Upper respiratory infections are caused by a large number of viruses; however, rhinovirus is the most common cause.   Symptoms vary from person to person, with common symptoms including sore throat, cough, fatigue or lack of energy and feeling of general discomfort.  A low-grade fever of up to 100.4 may present, but is often uncommon.  Symptoms vary however, and are closely related to a person's age or underlying illnesses.  The most common symptoms associated with an upper respiratory infection are nasal discharge or congestion, cough, sneezing, headache and pressure in the ears and face.  These symptoms usually persist for about 3 to 10 days, but can last up to 2 weeks.  It is important to know that upper respiratory infections do not cause serious illness or complications in most cases.    Upper respiratory infections can be transmitted from person to person, with the most common method of transmission being a person's hands.  The virus is able to live on the skin and can infect other persons for up to 2 hours after direct contact.  Also, these can be transmitted when someone coughs or sneezes; thus, it is important to cover the mouth to reduce this risk.  To keep the spread of the illness at bay, good hand hygiene is very important.  This is an infection that is most likely caused by a virus. There are no specific treatments other than to help you with the symptoms until the infection runs its course.  We are sorry you are not feeling well.  Here is how we plan to help!   For nasal congestion, you may use an oral decongestants such as Mucinex D or if you have glaucoma or high blood pressure use plain Mucinex.  Saline nasal spray or nasal drops can help and can safely be used as often as needed for congestion.  For your congestion,  I have prescribed Ipratropium Bromide nasal spray 0.03% two sprays in each nostril 2-3 times a day  If you do not have a history of heart disease, hypertension, diabetes or thyroid disease, prostate/bladder issues or glaucoma, you may also use Sudafed to treat nasal congestion.  It is highly recommended that you consult with a pharmacist or your primary care physician to ensure this medication is safe for you to take.     If you have a cough, you may use cough suppressants such as Delsym and Robitussin.  If you have glaucoma or high blood pressure, you can also use Coricidin HBP.   For cough I have prescribed for you A prescription cough medication called Tessalon Perles 100 mg. You may take 1-2 capsules every 8 hours as needed for cough  If you have a sore or scratchy throat, use a saltwater gargle-  to  teaspoon of salt dissolved in a 4-ounce to 8-ounce glass of warm water.  Gargle the solution for approximately 15-30 seconds and then spit.  It is important not to swallow the solution.  You can also use throat lozenges/cough drops and Chloraseptic spray to help with throat pain or discomfort.  Warm or cold liquids can also be helpful in relieving throat pain.  For headache, pain or general discomfort, you can use Ibuprofen or Tylenol as directed.   Some authorities believe that zinc sprays or   the use of Echinacea may shorten the course of your symptoms.   HOME CARE . Only take medications as instructed by your medical team. . Be sure to drink plenty of fluids. Water is fine as well as fruit juices, sodas and electrolyte beverages. You may want to stay away from caffeine or alcohol. If you are nauseated, try taking small sips of liquids. How do you know if you are getting enough fluid? Your urine should be a pale yellow or almost colorless. . Get rest. . Taking a steamy shower or using a humidifier may help nasal congestion and ease sore throat pain. You can place a towel over your head and  breathe in the steam from hot water coming from a faucet. . Using a saline nasal spray works much the same way. . Cough drops, hard candies and sore throat lozenges may ease your cough. . Avoid close contacts especially the very young and the elderly . Cover your mouth if you cough or sneeze . Always remember to wash your hands.   GET HELP RIGHT AWAY IF: . You develop worsening fever. . If your symptoms do not improve within 10 days . You develop yellow or green discharge from your nose over 3 days. . You have coughing fits . You develop a severe head ache or visual changes. . You develop shortness of breath, difficulty breathing or start having chest pain . Your symptoms persist after you have completed your treatment plan  MAKE SURE YOU   Understand these instructions.  Will watch your condition.  Will get help right away if you are not doing well or get worse.  Your e-visit answers were reviewed by a board certified advanced clinical practitioner to complete your personal care plan. Depending upon the condition, your plan could have included both over the counter or prescription medications. Please review your pharmacy choice. If there is a problem, you may call our nursing hot line at and have the prescription routed to another pharmacy. Your safety is important to us. If you have drug allergies check your prescription carefully.   You can use MyChart to ask questions about today's visit, request a non-urgent call back, or ask for a work or school excuse for 24 hours related to this e-Visit. If it has been greater than 24 hours you will need to follow up with your provider, or enter a new e-Visit to address those concerns. You will get an e-mail in the next two days asking about your experience.  I hope that your e-visit has been valuable and will speed your recovery. Thank you for using e-visits.   Approximately 5 minutes was spent documenting and reviewing patient's  chart.    

## 2021-04-06 ENCOUNTER — Other Ambulatory Visit (HOSPITAL_BASED_OUTPATIENT_CLINIC_OR_DEPARTMENT_OTHER): Payer: Self-pay

## 2021-04-06 ENCOUNTER — Other Ambulatory Visit: Payer: Self-pay | Admitting: Sports Medicine

## 2021-04-06 MED ORDER — ALPRAZOLAM 0.25 MG PO TABS
ORAL_TABLET | ORAL | 0 refills | Status: DC
  Filled 2021-04-06 – 2021-04-11 (×2): qty 20, 10d supply, fill #0

## 2021-04-08 ENCOUNTER — Other Ambulatory Visit (HOSPITAL_BASED_OUTPATIENT_CLINIC_OR_DEPARTMENT_OTHER): Payer: Self-pay

## 2021-04-11 ENCOUNTER — Other Ambulatory Visit (HOSPITAL_BASED_OUTPATIENT_CLINIC_OR_DEPARTMENT_OTHER): Payer: Self-pay

## 2021-04-11 ENCOUNTER — Encounter: Payer: Self-pay | Admitting: Obstetrics & Gynecology

## 2021-04-11 ENCOUNTER — Other Ambulatory Visit: Payer: Self-pay

## 2021-04-11 ENCOUNTER — Encounter: Payer: Self-pay | Admitting: General Practice

## 2021-04-11 ENCOUNTER — Ambulatory Visit (INDEPENDENT_AMBULATORY_CARE_PROVIDER_SITE_OTHER): Payer: 59 | Admitting: Obstetrics & Gynecology

## 2021-04-11 ENCOUNTER — Other Ambulatory Visit (HOSPITAL_COMMUNITY)
Admission: RE | Admit: 2021-04-11 | Discharge: 2021-04-11 | Disposition: A | Discharge status: Home / Self Care | Payer: 59 | Source: Ambulatory Visit | Attending: Obstetrics & Gynecology | Admitting: Obstetrics & Gynecology

## 2021-04-11 VITALS — BP 118/75 | HR 85 | Wt 205.0 lb

## 2021-04-11 DIAGNOSIS — R87612 Low grade squamous intraepithelial lesion on cytologic smear of cervix (LGSIL): Secondary | ICD-10-CM

## 2021-04-11 DIAGNOSIS — N841 Polyp of cervix uteri: Secondary | ICD-10-CM | POA: Diagnosis not present

## 2021-04-11 DIAGNOSIS — B977 Papillomavirus as the cause of diseases classified elsewhere: Secondary | ICD-10-CM | POA: Diagnosis not present

## 2021-04-11 DIAGNOSIS — N870 Mild cervical dysplasia: Secondary | ICD-10-CM | POA: Diagnosis not present

## 2021-04-11 LAB — POCT URINE PREGNANCY: Preg Test, Ur: NEGATIVE

## 2021-04-11 NOTE — Progress Notes (Signed)
Patient given informed consent, signed copy in the chart, time out was performed.  Placed in lithotomy position. Cervix viewed with speculum and colposcope after application of acetic acid.  02/03/2021  High risk HPV PositiveAbnormal   HPV 16 Negative   HPV 18 / 45 Negative   Adequacy Satisfactory for evaluation; transformation zone component ABSENT.   Diagnosis - Low grade squamous intraepithelial lesion (LSIL)Abnormal   Comment Normal Reference Range HPV - Negative   Comment Normal Reference Range HPV 16 18 45 -Negative     Colposcopy adequate?  yes Acetowhite lesions? yes Punctation? No Mosaicism?  No Abnormal vasculature?  no Biopsies? Yes. A polyp was noted within the int os. This was removed with a Kelly clamp.   ECC? yes   Patient was given post procedure instructions.  She will return in 2 weeks for results.  Salif Tay L. Harraway-Smith, M.D., [place]

## 2021-04-12 ENCOUNTER — Telehealth: Payer: Self-pay

## 2021-04-12 NOTE — Telephone Encounter (Signed)
Wilkie Aye from [facility] called [facility]. Requests verification regarding recent specimens sent to lab. Please call back at [phone] to verify specimens. Routed to correct office.

## 2021-04-13 LAB — SURGICAL PATHOLOGY

## 2021-04-15 ENCOUNTER — Telehealth: Payer: Self-pay

## 2021-04-15 NOTE — Telephone Encounter (Signed)
Pt called, and given results. Pt verbalizes understanding and is aware she will need a f/u in 1 year for a repeat pap smear. Pt has no questions at this time.

## 2021-04-29 ENCOUNTER — Ambulatory Visit: Payer: 59 | Admitting: Obstetrics & Gynecology

## 2021-06-28 ENCOUNTER — Other Ambulatory Visit (HOSPITAL_BASED_OUTPATIENT_CLINIC_OR_DEPARTMENT_OTHER): Payer: Self-pay

## 2021-06-28 ENCOUNTER — Other Ambulatory Visit: Payer: Self-pay | Admitting: Sports Medicine

## 2021-06-28 MED ORDER — ALPRAZOLAM 0.25 MG PO TABS
ORAL_TABLET | ORAL | 0 refills | Status: DC
  Filled 2021-06-28: qty 20, 10d supply, fill #0

## 2021-09-15 ENCOUNTER — Other Ambulatory Visit (HOSPITAL_BASED_OUTPATIENT_CLINIC_OR_DEPARTMENT_OTHER): Payer: Self-pay

## 2021-09-15 MED ORDER — INFLUENZA VAC SPLIT QUAD 0.5 ML IM SUSY
PREFILLED_SYRINGE | INTRAMUSCULAR | 0 refills | Status: DC
  Filled 2021-09-15: qty 0.5, 1d supply, fill #0

## 2021-09-27 DIAGNOSIS — Z1329 Encounter for screening for other suspected endocrine disorder: Secondary | ICD-10-CM | POA: Diagnosis not present

## 2021-09-27 DIAGNOSIS — E519 Thiamine deficiency, unspecified: Secondary | ICD-10-CM | POA: Diagnosis not present

## 2021-09-27 DIAGNOSIS — Z131 Encounter for screening for diabetes mellitus: Secondary | ICD-10-CM | POA: Diagnosis not present

## 2021-09-27 DIAGNOSIS — E789 Disorder of lipoprotein metabolism, unspecified: Secondary | ICD-10-CM | POA: Diagnosis not present

## 2022-02-07 ENCOUNTER — Encounter: Payer: 59 | Admitting: Sports Medicine

## 2022-03-24 ENCOUNTER — Other Ambulatory Visit (HOSPITAL_BASED_OUTPATIENT_CLINIC_OR_DEPARTMENT_OTHER): Payer: Self-pay

## 2022-03-24 ENCOUNTER — Ambulatory Visit (INDEPENDENT_AMBULATORY_CARE_PROVIDER_SITE_OTHER): Payer: No Typology Code available for payment source | Admitting: Sports Medicine

## 2022-03-24 ENCOUNTER — Encounter: Payer: Self-pay | Admitting: Sports Medicine

## 2022-03-24 VITALS — BP 117/78 | HR 87 | Ht 65.0 in | Wt 206.0 lb

## 2022-03-24 DIAGNOSIS — F411 Generalized anxiety disorder: Secondary | ICD-10-CM | POA: Diagnosis not present

## 2022-03-24 DIAGNOSIS — F419 Anxiety disorder, unspecified: Secondary | ICD-10-CM | POA: Diagnosis not present

## 2022-03-24 DIAGNOSIS — M5442 Lumbago with sciatica, left side: Secondary | ICD-10-CM

## 2022-03-24 DIAGNOSIS — E782 Mixed hyperlipidemia: Secondary | ICD-10-CM

## 2022-03-24 DIAGNOSIS — F32A Depression, unspecified: Secondary | ICD-10-CM

## 2022-03-24 DIAGNOSIS — Z0000 Encounter for general adult medical examination without abnormal findings: Secondary | ICD-10-CM | POA: Diagnosis not present

## 2022-03-24 DIAGNOSIS — E6609 Other obesity due to excess calories: Secondary | ICD-10-CM

## 2022-03-24 DIAGNOSIS — G8929 Other chronic pain: Secondary | ICD-10-CM

## 2022-03-24 DIAGNOSIS — Z1231 Encounter for screening mammogram for malignant neoplasm of breast: Secondary | ICD-10-CM

## 2022-03-24 MED ORDER — ALPRAZOLAM 0.5 MG PO TABS
0.5000 mg | ORAL_TABLET | Freq: Two times a day (BID) | ORAL | 3 refills | Status: DC | PRN
  Filled 2022-03-24: qty 10, 5d supply, fill #0

## 2022-03-24 MED ORDER — WEGOVY 0.25 MG/0.5ML ~~LOC~~ SOAJ
0.2500 mg | SUBCUTANEOUS | 0 refills | Status: DC
  Filled 2022-03-24: qty 2, 28d supply, fill #0

## 2022-03-24 MED ORDER — METHOCARBAMOL 500 MG PO TABS
500.0000 mg | ORAL_TABLET | Freq: Three times a day (TID) | ORAL | 0 refills | Status: DC
  Filled 2022-03-24: qty 90, 30d supply, fill #0

## 2022-03-24 NOTE — Assessment & Plan Note (Signed)
Annual physical as above, checking routine labs, she did have a colposcopy with benign findings, considering HPV on prior Pap smears I would like her to follow this up with her OB/GYN. ?She is interested in starting her breast cancer screening, we will go ahead and order mammogram. ?

## 2022-03-24 NOTE — Progress Notes (Addendum)
?Subjective:   ? ?CC: Annual Physical Exam ? ?HPI:  ?This patient is here for their annual physical ? ?I reviewed the past medical history, family history, social history, surgical history, and allergies today and no changes were needed.  Please see the problem list section below in epic for further details. ? ?Past Medical History: ?Past Medical History:  ?Diagnosis Date  ? Anxiety   ? Asthma   ? Pars defect   ? ?Past Surgical History: ?Past Surgical History:  ?Procedure Laterality Date  ? NO PAST SURGERIES    ? ?Social History: ?Social History  ? ?Socioeconomic History  ? Marital status: Significant Other  ?  Spouse name: Not on file  ? Number of children: Not on file  ? Years of education: Not on file  ? Highest education level: Not on file  ?Occupational History  ? Not on file  ?Tobacco Use  ? Smoking status: Never  ? Smokeless tobacco: Never  ?Vaping Use  ? Vaping Use: Never used  ?Substance and Sexual Activity  ? Alcohol use: No  ? Drug use: No  ? Sexual activity: Never  ?Other Topics Concern  ? Not on file  ?Social History Narrative  ? Full time student/plan to enter PA Program/currently [employer]  ? 1 son: 36 yrs old  ? Regular exercise: some  ? Caffeine use: 1 cup of coffee daily  ? ?Social Determinants of Health  ? ?Financial Resource Strain: Not on file  ?Food Insecurity: Not on file  ?Transportation Needs: Not on file  ?Physical Activity: Not on file  ?Stress: Not on file  ?Social Connections: Not on file  ? ?Family History: ?No family history on file. ?Allergies: ?Allergies  ?Allergen Reactions  ? Aspirin Other (See Comments)  ?  Severe migraines   ? ?Medications: See med rec. ? ?Review of Systems: No headache, visual changes, nausea, vomiting, diarrhea, constipation, dizziness, abdominal pain, skin rash, fevers, chills, night sweats, swollen lymph nodes, weight loss, chest pain, body aches, joint swelling, muscle aches, shortness of breath, mood changes, visual or auditory hallucinations. ? ?Objective:    ? ?General: Well Developed, well nourished, and in no acute distress.  ?Neuro: Alert and oriented x3, extra-ocular muscles intact, sensation grossly intact. Cranial nerves II through XII are intact, motor, sensory, and coordinative functions are all intact. ?HEENT: Normocephalic, atraumatic, pupils equal round reactive to light, neck supple, no masses, no lymphadenopathy, thyroid nonpalpable. Oropharynx, nasopharynx, external ear canals are unremarkable. ?Skin: Warm and dry, no rashes noted.  ?Cardiac: Regular rate and rhythm, no murmurs rubs or gallops.  ?Respiratory: Clear to auscultation bilaterally. Not using accessory muscles, speaking in full sentences.  ?Abdominal: Soft, nontender, nondistended, positive bowel sounds, no masses, no organomegaly.  ?Musculoskeletal: Shoulder, elbow, wrist, hip, knee, ankle stable, and with full range of motion. ? ?Impression and Recommendations:   ? ?The patient was counselled, risk factors were discussed, anticipatory guidance given. ? ?Annual physical exam ?Annual physical as above, checking routine labs, she did have a colposcopy with benign findings, considering HPV on prior Pap smears I would like her to follow this up with her OB/GYN. ?She is interested in starting her breast cancer screening, we will go ahead and order mammogram. ? ?Anxiety and depression ?Historically on sertraline, mood is well controlled now, she only gets an occasional panic attack, I think is totally reasonable to just do alprazolam rather than a controller agent. ?She typically needs about 3 pills or so a month. ? ?Obesity ?Patient is obese, she  does have comorbidities, she will be involved in a multidisciplinary weight loss program including diet, exercise, and calorie counting, for this reason we will go ahead and start [facility]. ?If unable to get [facility] approved we can try [facility], if unable to get [facility] approved then we will try cheap compounded semaglutide at the [place]. ? ? ?Mixed hyperlipidemia ?Mild hyperlipidemia, will likely improve as there is weight loss with GLP-1. ? ? ?___________________________________________ ?Gwen Her. Dianah Field, M.D., ABFM., CAQSM. ?Primary Care and Sports Medicine ?[place] ? ?Adjunct Professor of Family Medicine  ?[employer] ?

## 2022-03-24 NOTE — Assessment & Plan Note (Signed)
Patient is obese, she does have comorbidities, she will be involved in a multidisciplinary weight loss program including diet, exercise, and calorie counting, for this reason we will go ahead and start [facility]. ?If unable to get [facility] approved we can try [facility], if unable to get [facility] approved then we will try cheap compounded semaglutide at the [facility] compounding pharmacy. ? ?

## 2022-03-24 NOTE — Assessment & Plan Note (Signed)
Historically on sertraline, mood is well controlled now, she only gets an occasional panic attack, I think is totally reasonable to just do alprazolam rather than a controller agent. ?She typically needs about 3 pills or so a month. ?

## 2022-03-25 LAB — CBC
HCT: 42 % (ref 35.0–45.0)
Hemoglobin: 14.1 g/dL (ref 11.7–15.5)
MCH: 27.6 pg (ref 27.0–33.0)
MCHC: 33.6 g/dL (ref 32.0–36.0)
MCV: 82.4 fL (ref 80.0–100.0)
MPV: 10.6 fL (ref 7.5–12.5)
Platelets: 293 10*3/uL (ref 140–400)
RBC: 5.1 10*6/uL (ref 3.80–5.10)
RDW: 12.9 % (ref 11.0–15.0)
WBC: 7 10*3/uL (ref 3.8–10.8)

## 2022-03-25 LAB — LIPID PANEL
Cholesterol: 211 mg/dL — ABNORMAL HIGH (ref <200)
HDL: 53 mg/dL (ref >=50)
LDL Cholesterol (Calc): 136 mg/dL (calc) — ABNORMAL HIGH
Non-HDL Cholesterol (Calc): 158 mg/dL (calc) — ABNORMAL HIGH (ref <130)
Total CHOL/HDL Ratio: 4 (calc) (ref <5.0)
Triglycerides: 112 mg/dL (ref <150)

## 2022-03-25 LAB — COMPREHENSIVE METABOLIC PANEL
AG Ratio: 1.6 (calc) (ref 1.0–2.5)
ALT: 19 U/L (ref 6–29)
AST: 15 U/L (ref 10–30)
Albumin: 4.4 g/dL (ref 3.6–5.1)
Alkaline phosphatase (APISO): 50 U/L (ref 31–125)
BUN: 15 mg/dL (ref 7–25)
CO2: 26 mmol/L (ref 20–32)
Calcium: 9.2 mg/dL (ref 8.6–10.2)
Chloride: 104 mmol/L (ref 98–110)
Creat: 0.58 mg/dL (ref 0.50–0.99)
Globulin: 2.7 g/dL (calc) (ref 1.9–3.7)
Glucose, Bld: 87 mg/dL (ref 65–99)
Potassium: 4.2 mmol/L (ref 3.5–5.3)
Sodium: 140 mmol/L (ref 135–146)
Total Bilirubin: 0.5 mg/dL (ref 0.2–1.2)
Total Protein: 7.1 g/dL (ref 6.1–8.1)

## 2022-03-25 LAB — HEMOGLOBIN A1C
Hgb A1c MFr Bld: 5.3 % of total Hgb (ref <5.7)
Mean Plasma Glucose: 105 mg/dL
eAG (mmol/L): 5.8 mmol/L

## 2022-03-25 LAB — TSH: TSH: 1.2 mIU/L

## 2022-03-27 ENCOUNTER — Other Ambulatory Visit (HOSPITAL_BASED_OUTPATIENT_CLINIC_OR_DEPARTMENT_OTHER): Payer: Self-pay

## 2022-03-27 DIAGNOSIS — E782 Mixed hyperlipidemia: Secondary | ICD-10-CM | POA: Insufficient documentation

## 2022-03-27 NOTE — Assessment & Plan Note (Signed)
Mild hyperlipidemia, will likely improve as there is weight loss with GLP-1. ?

## 2022-03-28 ENCOUNTER — Other Ambulatory Visit (HOSPITAL_BASED_OUTPATIENT_CLINIC_OR_DEPARTMENT_OTHER): Payer: Self-pay

## 2022-03-30 ENCOUNTER — Telehealth: Payer: Self-pay

## 2022-03-30 ENCOUNTER — Other Ambulatory Visit (HOSPITAL_BASED_OUTPATIENT_CLINIC_OR_DEPARTMENT_OTHER): Payer: Self-pay

## 2022-03-30 NOTE — Telephone Encounter (Addendum)
Initiated Prior authorization for: Wegovy 0.25MG /0.5ML auto-injectors Via: Covermymeds [place]/Key:[id number] Status: approved  as of 03/29/22 Reason:The authorization is effective for a maximum of 7 fills from 03/31/2022 to 10/30/2022, as long as the member is enrolled in their current health plan. The request was approved as submitted. This request has been approved for 48ml per 28 days.Additional authorizations have been approved for the following:Wegovy 0.5mg /0.13mL allowing 45mL per 28 days; please reference authorization [id number].Wegovy 1mg /0.50mL allowing 36mL per 28 days; please reference authorization [id number].Wegovy 1.7mg /0.69mL allowing 40mL per 28 day; please reference authorization [id number].Wegovy 2.4mg /0.42mL allowing 67mL per 28 days; please reference authorization [id number]. A written notification letter will follow with additional details. Notified Pt via: Mychart

## 2022-03-31 ENCOUNTER — Other Ambulatory Visit (HOSPITAL_BASED_OUTPATIENT_CLINIC_OR_DEPARTMENT_OTHER): Payer: Self-pay

## 2022-04-18 ENCOUNTER — Encounter: Payer: Self-pay | Admitting: Sports Medicine

## 2022-04-18 DIAGNOSIS — E6609 Other obesity due to excess calories: Secondary | ICD-10-CM

## 2022-04-18 MED ORDER — WEGOVY 0.5 MG/0.5ML ~~LOC~~ SOAJ
0.5000 mg | SUBCUTANEOUS | 0 refills | Status: DC
  Filled 2022-04-21: qty 2, 28d supply, fill #0

## 2022-04-18 MED ORDER — WEGOVY 2.4 MG/0.75ML ~~LOC~~ SOAJ
2.4000 mg | SUBCUTANEOUS | 11 refills | Status: DC
  Filled 2022-07-05: qty 3, 28d supply, fill #0
  Filled 2022-08-05: qty 3, 28d supply, fill #1
  Filled 2022-08-31: qty 3, 28d supply, fill #2
  Filled 2022-09-27 – 2022-10-17 (×4): qty 3, 28d supply, fill #3

## 2022-04-18 MED ORDER — WEGOVY 1.7 MG/0.75ML ~~LOC~~ SOAJ
1.7000 mg | SUBCUTANEOUS | 0 refills | Status: DC
  Filled 2022-06-12: qty 3, 28d supply, fill #0

## 2022-04-18 MED ORDER — WEGOVY 1 MG/0.5ML ~~LOC~~ SOAJ
1.0000 mg | SUBCUTANEOUS | 0 refills | Status: DC
Start: 2022-04-18 — End: 2022-10-25
  Filled 2022-05-16: qty 2, 28d supply, fill #0

## 2022-04-21 ENCOUNTER — Other Ambulatory Visit (HOSPITAL_BASED_OUTPATIENT_CLINIC_OR_DEPARTMENT_OTHER): Payer: Self-pay

## 2022-04-24 ENCOUNTER — Other Ambulatory Visit (HOSPITAL_BASED_OUTPATIENT_CLINIC_OR_DEPARTMENT_OTHER): Payer: Self-pay

## 2022-05-16 ENCOUNTER — Other Ambulatory Visit (HOSPITAL_BASED_OUTPATIENT_CLINIC_OR_DEPARTMENT_OTHER): Payer: Self-pay

## 2022-05-25 ENCOUNTER — Ambulatory Visit (INDEPENDENT_AMBULATORY_CARE_PROVIDER_SITE_OTHER): Payer: No Typology Code available for payment source

## 2022-05-25 DIAGNOSIS — Z1231 Encounter for screening mammogram for malignant neoplasm of breast: Secondary | ICD-10-CM | POA: Diagnosis not present

## 2022-06-12 ENCOUNTER — Other Ambulatory Visit (HOSPITAL_BASED_OUTPATIENT_CLINIC_OR_DEPARTMENT_OTHER): Payer: Self-pay

## 2022-06-21 ENCOUNTER — Ambulatory Visit: Payer: No Typology Code available for payment source | Admitting: Obstetrics & Gynecology

## 2022-06-30 ENCOUNTER — Encounter (INDEPENDENT_AMBULATORY_CARE_PROVIDER_SITE_OTHER): Payer: No Typology Code available for payment source | Admitting: Sports Medicine

## 2022-06-30 DIAGNOSIS — E282 Polycystic ovarian syndrome: Secondary | ICD-10-CM

## 2022-06-30 MED ORDER — SPIRONOLACTONE 25 MG PO TABS: 25.0000 mg | ORAL_TABLET | Freq: Every day | ORAL | 11 refills | Status: DC

## 2022-06-30 MED ORDER — METFORMIN HCL ER 750 MG PO TB24: 750.0000 mg | ORAL_TABLET | Freq: Every day | ORAL | 11 refills | Status: DC

## 2022-06-30 NOTE — Telephone Encounter (Signed)
I spent 5 total minutes of online digital evaluation and management services in this patient-initiated request for online care. 

## 2022-06-30 NOTE — Addendum Note (Signed)
Addended by: Monica Becton on: 06/30/2022 03:35 PM   Modules accepted: Orders

## 2022-07-05 ENCOUNTER — Ambulatory Visit (INDEPENDENT_AMBULATORY_CARE_PROVIDER_SITE_OTHER): Payer: No Typology Code available for payment source | Admitting: Obstetrics & Gynecology

## 2022-07-05 ENCOUNTER — Encounter: Payer: Self-pay | Admitting: Obstetrics & Gynecology

## 2022-07-05 ENCOUNTER — Other Ambulatory Visit (HOSPITAL_COMMUNITY)
Admission: RE | Admit: 2022-07-05 | Discharge: 2022-07-05 | Disposition: A | Discharge status: Home / Self Care | Payer: No Typology Code available for payment source | Source: Ambulatory Visit | Attending: Obstetrics & Gynecology | Admitting: Obstetrics & Gynecology

## 2022-07-05 ENCOUNTER — Other Ambulatory Visit (HOSPITAL_BASED_OUTPATIENT_CLINIC_OR_DEPARTMENT_OTHER): Payer: Self-pay

## 2022-07-05 VITALS — BP 113/84 | HR 102 | Ht 65.0 in | Wt 188.0 lb

## 2022-07-05 DIAGNOSIS — Z01419 Encounter for gynecological examination (general) (routine) without abnormal findings: Secondary | ICD-10-CM

## 2022-07-05 DIAGNOSIS — N979 Female infertility, unspecified: Secondary | ICD-10-CM

## 2022-07-05 NOTE — Progress Notes (Signed)
Subjective:     Carla Morris is a 41 y.o. female here for a routine exam. Reports h/o PCOS. Monthly cycles. No c/o heavy menses. Current complaints: pt and partner have been trying to conceive for 1 year without success. She has 1 8 yo child. He has no children. Has not had a semen analysis.   She is currently on Wegovy, Metformin and Spirolactone.  Has lost 25# on Wegovy. Would like to cont until she conceives.      Gynecologic History No LMP recorded. Contraception: none Last Pap: 02/03/2021  High risk HPV Positive Abnormal    HPV 16 Negative   HPV 18 / 45 Negative   Adequacy Satisfactory for evaluation; transformation zone component ABSENT.   Diagnosis - Low grade squamous intraepithelial lesion (LSIL) Abnormal    Comment Normal Reference Range HPV - Negative   Comment Normal Reference Range HPV 16 18 45 -Negative    04/11/2021 FINAL MICROSCOPIC DIAGNOSIS:   A. CERVIX, BIOPSY:  - Benign cervical glandular mucosa  - Negative for dysplasia or malignancy   B. ENDOCERVIX, CURETTAGE:  - Benign cervical glandular mucosa  - Negative for dysplasia or malignancy   C. ENDOCERVIX, POLYPECTOMY:  - Low-grade squamous intraepithelial lesion (CIN1, low grade dysplasia)  involving a cervical polyp   Last mammogram: 05/25/2022. Results were: normal  Obstetric History OB History  Gravida Para Term Preterm AB Living  2 1 1   1 1   SAB IAB Ectopic Multiple Live Births    1     1    # Outcome Date GA Lbr Len/2nd Weight Sex Delivery Anes PTL Lv  2 Term 2009 [redacted]w[redacted]d   M Vag-Spont EPI N LIV  1 IAB              The following portions of the patient's history were reviewed and updated as appropriate: allergies, current medications, past family history, past medical history, past social history, past surgical history, and problem list.  Review of Systems Pertinent items are noted in HPI.    Objective:  BP 113/84   Pulse (!) 102   Ht 5\' 5"  (1.651 m)   Wt 188 lb (85.3 kg)   LMP 06/08/2022  (Exact Date)   BMI 31.28 kg/m   General Appearance:    Alert, cooperative, no distress, appears stated age  Head:    Normocephalic, without obvious abnormality, atraumatic  Eyes:    conjunctiva/corneas clear, EOM's intact, both eyes  Ears:    Normal external ear canals, both ears  Nose:   Nares normal, septum midline, mucosa normal, no drainage    or sinus tenderness  Throat:   Lips, mucosa, and tongue normal; teeth and gums normal  Neck:   Supple, symmetrical, trachea midline, no adenopathy;    thyroid:  no enlargement/tenderness/nodules  Back:     Symmetric, no curvature, ROM normal, no CVA tenderness  Lungs:     respirations unlabored  Chest Wall:    No tenderness or deformity   Heart:    Regular rate and rhythm  Breast Exam:    No tenderness, masses, or nipple abnormality  Abdomen:     Soft, non-tender, bowel sounds active all four quadrants,    no masses, no organomegaly  Genitalia:    Normal female without lesion, discharge or tenderness     Extremities:   Extremities normal, atraumatic, no cyanosis or edema  Pulses:   2+ and symmetric all extremities  Skin:   Skin color, texture, turgor normal, no  rashes or lesions. Several colorful tats. Left shin, arms bilaterally.       Assessment:    Healthy female exam.    Plan:   Diagnoses and all orders for this visit:  Well female exam with routine gynecological exam -     Cytology - PAP( [place])  Infertility, female, secondary   Reviewed ovulation and best times for conceiving. Reviewed female and female infertility and rec semen analysis for her partner.   Semen analysis  Begin PNV daily  Rec stopping Wagovy and Spironolactone prior to conception   Alishea Beaudin L. Harraway-Smith, M.D., [place]

## 2022-07-12 LAB — CYTOLOGY - PAP
Chlamydia: NEGATIVE
Comment: NEGATIVE
Comment: NEGATIVE
Comment: NEGATIVE
Comment: NORMAL
Diagnosis: NEGATIVE
High risk HPV: NEGATIVE
Neisseria Gonorrhea: NEGATIVE
Trichomonas: NEGATIVE

## 2022-08-05 ENCOUNTER — Encounter: Payer: Self-pay | Admitting: Obstetrics & Gynecology

## 2022-08-07 ENCOUNTER — Other Ambulatory Visit (HOSPITAL_BASED_OUTPATIENT_CLINIC_OR_DEPARTMENT_OTHER): Payer: Self-pay

## 2022-08-13 ENCOUNTER — Encounter (INDEPENDENT_AMBULATORY_CARE_PROVIDER_SITE_OTHER): Payer: No Typology Code available for payment source | Admitting: Sports Medicine

## 2022-08-13 DIAGNOSIS — F32A Depression, unspecified: Secondary | ICD-10-CM

## 2022-08-13 DIAGNOSIS — F419 Anxiety disorder, unspecified: Secondary | ICD-10-CM | POA: Diagnosis not present

## 2022-08-14 NOTE — Telephone Encounter (Signed)
I spent 5 total minutes of online digital evaluation and management services in this patient-initiated request for online care. 

## 2022-08-17 ENCOUNTER — Other Ambulatory Visit: Payer: Self-pay

## 2022-08-17 DIAGNOSIS — Z8742 Personal history of other diseases of the female genital tract: Secondary | ICD-10-CM

## 2022-08-18 ENCOUNTER — Telehealth (HOSPITAL_BASED_OUTPATIENT_CLINIC_OR_DEPARTMENT_OTHER): Payer: Self-pay

## 2022-08-24 ENCOUNTER — Telehealth (HOSPITAL_BASED_OUTPATIENT_CLINIC_OR_DEPARTMENT_OTHER): Payer: Self-pay

## 2022-08-31 ENCOUNTER — Other Ambulatory Visit (HOSPITAL_BASED_OUTPATIENT_CLINIC_OR_DEPARTMENT_OTHER): Payer: Self-pay

## 2022-09-27 ENCOUNTER — Other Ambulatory Visit (HOSPITAL_COMMUNITY): Payer: Self-pay

## 2022-09-27 ENCOUNTER — Other Ambulatory Visit (HOSPITAL_BASED_OUTPATIENT_CLINIC_OR_DEPARTMENT_OTHER): Payer: Self-pay

## 2022-09-29 ENCOUNTER — Other Ambulatory Visit (HOSPITAL_BASED_OUTPATIENT_CLINIC_OR_DEPARTMENT_OTHER): Payer: Self-pay

## 2022-10-02 ENCOUNTER — Other Ambulatory Visit (HOSPITAL_BASED_OUTPATIENT_CLINIC_OR_DEPARTMENT_OTHER): Payer: Self-pay

## 2022-10-03 ENCOUNTER — Other Ambulatory Visit (HOSPITAL_BASED_OUTPATIENT_CLINIC_OR_DEPARTMENT_OTHER): Payer: Self-pay

## 2022-10-04 ENCOUNTER — Other Ambulatory Visit (HOSPITAL_BASED_OUTPATIENT_CLINIC_OR_DEPARTMENT_OTHER): Payer: Self-pay

## 2022-10-05 ENCOUNTER — Other Ambulatory Visit (HOSPITAL_BASED_OUTPATIENT_CLINIC_OR_DEPARTMENT_OTHER): Payer: Self-pay

## 2022-10-06 ENCOUNTER — Other Ambulatory Visit (HOSPITAL_BASED_OUTPATIENT_CLINIC_OR_DEPARTMENT_OTHER): Payer: Self-pay

## 2022-10-09 ENCOUNTER — Other Ambulatory Visit (HOSPITAL_BASED_OUTPATIENT_CLINIC_OR_DEPARTMENT_OTHER): Payer: Self-pay

## 2022-10-09 ENCOUNTER — Encounter: Payer: Self-pay | Admitting: Sports Medicine

## 2022-10-10 ENCOUNTER — Telehealth: Payer: Self-pay

## 2022-10-10 ENCOUNTER — Other Ambulatory Visit (HOSPITAL_BASED_OUTPATIENT_CLINIC_OR_DEPARTMENT_OTHER): Payer: Self-pay

## 2022-10-10 NOTE — Telephone Encounter (Signed)
Initiated Prior authorization YEB:XIDHWY 1.7MG /0.75ML auto-injectors Via: Covermymeds [place]/Key:[id number] Status: approved  as of 10/10/22 Reason:The request has been approved. The authorization is effective from 10/30/2022 to 10/30/2023, as long as the member is enrolled in their current health plan. The request was approved as submitted. This request has been approved for 87mL per 28 days.Additional authorizations have been entered for the following, effective 10/30/2022 through 12/10/2024Wegovy 0.25mg /0.49mL allowing 51mL per 28 days; please reference authorization [id number],Wegovy 0.5mg /0.65mL allowing 74mL per 28 days; please reference authorization [id number],Wegovy 1mg /0.8mL allowing 81mL per 28 days; please reference authorization [id number],Wegovy 2.4mg /0.49mL allowing 3mL per 28 day; please reference authorization [id number]. This medication must be filled at [facility]. Please call [phone] Notified Pt via: Mychart

## 2022-10-11 ENCOUNTER — Other Ambulatory Visit (HOSPITAL_BASED_OUTPATIENT_CLINIC_OR_DEPARTMENT_OTHER): Payer: Self-pay

## 2022-10-16 ENCOUNTER — Other Ambulatory Visit (HOSPITAL_BASED_OUTPATIENT_CLINIC_OR_DEPARTMENT_OTHER): Payer: Self-pay

## 2022-10-17 ENCOUNTER — Other Ambulatory Visit (HOSPITAL_BASED_OUTPATIENT_CLINIC_OR_DEPARTMENT_OTHER): Payer: Self-pay

## 2022-10-18 ENCOUNTER — Other Ambulatory Visit (HOSPITAL_BASED_OUTPATIENT_CLINIC_OR_DEPARTMENT_OTHER): Payer: Self-pay

## 2022-10-19 ENCOUNTER — Other Ambulatory Visit (HOSPITAL_BASED_OUTPATIENT_CLINIC_OR_DEPARTMENT_OTHER): Payer: Self-pay

## 2022-10-20 ENCOUNTER — Other Ambulatory Visit (HOSPITAL_BASED_OUTPATIENT_CLINIC_OR_DEPARTMENT_OTHER): Payer: Self-pay

## 2022-10-21 ENCOUNTER — Other Ambulatory Visit: Payer: Self-pay | Admitting: Sports Medicine

## 2022-10-21 DIAGNOSIS — F32A Depression, unspecified: Secondary | ICD-10-CM

## 2022-10-21 DIAGNOSIS — F411 Generalized anxiety disorder: Secondary | ICD-10-CM

## 2022-10-22 MED ORDER — ALPRAZOLAM 0.5 MG PO TABS
0.5000 mg | ORAL_TABLET | Freq: Two times a day (BID) | ORAL | 3 refills | Status: DC | PRN
  Filled 2022-10-22: qty 10, 5d supply, fill #0
  Filled 2023-02-08: qty 10, 5d supply, fill #1
  Filled 2023-03-09: qty 10, 5d supply, fill #2

## 2022-10-23 ENCOUNTER — Other Ambulatory Visit (HOSPITAL_COMMUNITY): Payer: Self-pay

## 2022-10-23 ENCOUNTER — Other Ambulatory Visit (HOSPITAL_BASED_OUTPATIENT_CLINIC_OR_DEPARTMENT_OTHER): Payer: Self-pay

## 2022-10-24 ENCOUNTER — Other Ambulatory Visit (HOSPITAL_COMMUNITY): Payer: Self-pay

## 2022-10-24 ENCOUNTER — Encounter (HOSPITAL_COMMUNITY): Payer: Self-pay

## 2022-10-24 ENCOUNTER — Other Ambulatory Visit (HOSPITAL_BASED_OUTPATIENT_CLINIC_OR_DEPARTMENT_OTHER): Payer: Self-pay

## 2022-10-25 ENCOUNTER — Other Ambulatory Visit (HOSPITAL_COMMUNITY): Payer: Self-pay

## 2022-10-25 ENCOUNTER — Other Ambulatory Visit (HOSPITAL_BASED_OUTPATIENT_CLINIC_OR_DEPARTMENT_OTHER): Payer: Self-pay

## 2022-10-25 ENCOUNTER — Telehealth: Payer: No Typology Code available for payment source | Admitting: Physician Assistant

## 2022-10-25 DIAGNOSIS — H00014 Hordeolum externum left upper eyelid: Secondary | ICD-10-CM

## 2022-10-25 MED ORDER — ERYTHROMYCIN 5 MG/GM OP OINT
1.0000 | TOPICAL_OINTMENT | Freq: Two times a day (BID) | OPHTHALMIC | 0 refills | Status: DC
  Filled 2022-10-25: qty 3.5, 7d supply, fill #0

## 2022-10-25 NOTE — Progress Notes (Signed)
  E-Visit for Stye   We are sorry that you are not feeling well. Here is how we plan to help!  Based on what you have shared with me it looks like you have a stye.  A stye is an inflammation of the eyelid.  It is often a red, painful lump near the edge of the eyelid that may look like a boil or a pimple.  A stye develops when an infection occurs at the base of an eyelash.   We have made appropriate suggestions for you based upon your presentation: Simple styes can be treated without medical intervention.  Most styes either resolve spontaneously or resolve with simple home treatment by applying warm compresses or heated washcloth to the stye for about 10-15 minutes three to four times a day. This causes the stye to drain and resolve.  I have prescribed Erythromycin ointment. Apply a small amount to the affected eyelid twice daily (one dose being right before bedtime) for 5 days  HOME CARE:  Wash your hands often! Let the stye open on its own. Don't squeeze or open it. Don't rub your eyes. This can irritate your eyes and let in bacteria.  If you need to touch your eyes, wash your hands first. Don't wear eye makeup or contact lenses until the area has healed.  GET HELP RIGHT AWAY IF:  Your symptoms do not improve. You develop blurred or loss of vision. Your symptoms worsen (increased discharge, pain or redness).   Thank you for choosing an e-visit.  Your e-visit answers were reviewed by a board certified advanced clinical practitioner to complete your personal care plan. Depending upon the condition, your plan could have included both over the counter or prescription medications.  Please review your pharmacy choice. Make sure the pharmacy is open so you can pick up prescription now. If there is a problem, you may contact your provider through [employer] and have the prescription routed to another pharmacy.  Your safety is important to [place]. If you have drug allergies check your  prescription carefully.   For the next 24 hours you can use MyChart to ask questions about today's visit, request a non-urgent call back, or ask for a work or school excuse. You will get an email in the next two days asking about your experience. I hope that your e-visit has been valuable and will speed your recovery.  I have spent 5 minutes in review of e-visit questionnaire, review and updating patient chart, medical decision making and response to patient.   Margaretann Loveless, PA-C

## 2022-10-26 ENCOUNTER — Other Ambulatory Visit (HOSPITAL_BASED_OUTPATIENT_CLINIC_OR_DEPARTMENT_OTHER): Payer: Self-pay

## 2022-10-30 ENCOUNTER — Other Ambulatory Visit (HOSPITAL_BASED_OUTPATIENT_CLINIC_OR_DEPARTMENT_OTHER): Payer: Self-pay

## 2022-10-30 MED ORDER — WEGOVY 2.4 MG/0.75ML ~~LOC~~ SOAJ
2.4000 mg | SUBCUTANEOUS | 11 refills | Status: DC
  Filled 2022-10-30: qty 3, 28d supply, fill #0
  Filled 2022-11-28: qty 3, 28d supply, fill #1
  Filled 2022-12-10 – 2022-12-20 (×2): qty 3, 28d supply, fill #2
  Filled 2023-01-17: qty 3, 28d supply, fill #3
  Filled 2023-02-08: qty 3, 28d supply, fill #4
  Filled 2023-02-09 (×2): qty 3, 28d supply, fill #0
  Filled 2023-03-09: qty 3, 28d supply, fill #1

## 2022-10-30 NOTE — Addendum Note (Signed)
Addended by: Monica Becton on: 10/30/2022 09:43 AM   Modules accepted: Orders

## 2022-10-31 ENCOUNTER — Other Ambulatory Visit (HOSPITAL_BASED_OUTPATIENT_CLINIC_OR_DEPARTMENT_OTHER): Payer: Self-pay

## 2022-11-05 ENCOUNTER — Emergency Department (HOSPITAL_COMMUNITY): Payer: No Typology Code available for payment source

## 2022-11-05 ENCOUNTER — Other Ambulatory Visit: Payer: Self-pay

## 2022-11-05 ENCOUNTER — Emergency Department (HOSPITAL_COMMUNITY)
Admission: EM | Admit: 2022-11-05 | Discharge: 2022-11-05 | Disposition: A | Discharge status: Home / Self Care | Payer: No Typology Code available for payment source | Attending: Emergency Medicine | Admitting: Emergency Medicine

## 2022-11-05 ENCOUNTER — Encounter (HOSPITAL_COMMUNITY): Payer: Self-pay

## 2022-11-05 DIAGNOSIS — K529 Noninfective gastroenteritis and colitis, unspecified: Secondary | ICD-10-CM | POA: Diagnosis not present

## 2022-11-05 DIAGNOSIS — D72829 Elevated white blood cell count, unspecified: Secondary | ICD-10-CM | POA: Diagnosis not present

## 2022-11-05 DIAGNOSIS — Z20822 Contact with and (suspected) exposure to covid-19: Secondary | ICD-10-CM | POA: Insufficient documentation

## 2022-11-05 DIAGNOSIS — R197 Diarrhea, unspecified: Secondary | ICD-10-CM

## 2022-11-05 LAB — COMPREHENSIVE METABOLIC PANEL
ALT: 31 U/L (ref 0–44)
AST: 31 U/L (ref 15–41)
Albumin: 4.6 g/dL (ref 3.5–5.0)
Alkaline Phosphatase: 52 U/L (ref 38–126)
Anion gap: 11 (ref 5–15)
BUN: 16 mg/dL (ref 6–20)
CO2: 25 mmol/L (ref 22–32)
Calcium: 9.5 mg/dL (ref 8.9–10.3)
Chloride: 104 mmol/L (ref 98–111)
Creatinine, Ser: 0.7 mg/dL (ref 0.44–1.00)
GFR, Estimated: >60 mL/min (ref >60)
Glucose, Bld: 148 mg/dL — ABNORMAL HIGH (ref 70–99)
Potassium: 3.6 mmol/L (ref 3.5–5.1)
Sodium: 140 mmol/L (ref 135–145)
Total Bilirubin: 0.7 mg/dL (ref 0.3–1.2)
Total Protein: 8.8 g/dL — ABNORMAL HIGH (ref 6.5–8.1)

## 2022-11-05 LAB — RESP PANEL BY RT-PCR (RSV, FLU A&B, COVID)  RVPGX2
Influenza A by PCR: NEGATIVE
Influenza B by PCR: NEGATIVE
Resp Syncytial Virus by PCR: NEGATIVE
SARS Coronavirus 2 by RT PCR: NEGATIVE

## 2022-11-05 LAB — CBC
HCT: 44.5 % (ref 36.0–46.0)
Hemoglobin: 14.7 g/dL (ref 12.0–15.0)
MCH: 27.3 pg (ref 26.0–34.0)
MCHC: 33 g/dL (ref 30.0–36.0)
MCV: 82.7 fL (ref 80.0–100.0)
Platelets: 405 10*3/uL — ABNORMAL HIGH (ref 150–400)
RBC: 5.38 MIL/uL — ABNORMAL HIGH (ref 3.87–5.11)
RDW: 12.5 % (ref 11.5–15.5)
WBC: 18.2 10*3/uL — ABNORMAL HIGH (ref 4.0–10.5)
nRBC: 0 % (ref 0.0–0.2)

## 2022-11-05 LAB — HCG, QUANTITATIVE, PREGNANCY: hCG, Beta Chain, Quant, S: <1 m[IU]/mL (ref <5)

## 2022-11-05 LAB — PROTIME-INR
INR: 1 (ref 0.8–1.2)
Prothrombin Time: 13.1 seconds (ref 11.4–15.2)

## 2022-11-05 MED ORDER — PROMETHAZINE HCL 25 MG RE SUPP: 25.0000 mg | Freq: Four times a day (QID) | RECTAL | 0 refills | Status: DC | PRN

## 2022-11-05 MED ORDER — IOHEXOL 300 MG/ML  SOLN
100.0000 mL | Freq: Once | INTRAMUSCULAR | Status: AC | PRN
  Administered 2022-11-05: 100 mL via INTRAVENOUS

## 2022-11-05 MED ORDER — PROMETHAZINE HCL 25 MG PO TABS: 25.0000 mg | ORAL_TABLET | Freq: Four times a day (QID) | ORAL | 0 refills | Status: DC | PRN

## 2022-11-05 MED ORDER — LACTATED RINGERS IV SOLN: INTRAVENOUS | Status: DC

## 2022-11-05 MED ORDER — LACTATED RINGERS IV BOLUS
1000.0000 mL | Freq: Once | INTRAVENOUS | Status: AC
  Administered 2022-11-05: 1000 mL via INTRAVENOUS

## 2022-11-05 MED ORDER — ONDANSETRON HCL 4 MG/2ML IJ SOLN
4.0000 mg | Freq: Once | INTRAMUSCULAR | Status: AC
  Administered 2022-11-05: 4 mg via INTRAVENOUS
  Filled 2022-11-05: qty 2

## 2022-11-05 MED ORDER — SODIUM CHLORIDE 0.9 % IV SOLN
25.0000 mg | Freq: Once | INTRAVENOUS | Status: AC
  Administered 2022-11-05: 25 mg via INTRAVENOUS
  Filled 2022-11-05: qty 1

## 2022-11-05 NOTE — ED Triage Notes (Signed)
Patient c/o lower abdominal pain, vomiting, and bright red diarrhea that started last night.

## 2022-11-05 NOTE — Discharge Instructions (Addendum)
1.  Will culture was done in the emergency department.  You may follow-up on the results in MyChart.  Your CT scan did not show any bowel obstruction or other immediate problems. 2.  Take Phenergan as needed for nausea and vomiting.  Try to stay hydrated by drinking frequent small sips of fluids.  Take extra strength Tylenol for body aches or fever. 3.  Try to schedule a recheck with your doctor in the next 2 to 3 days.  If symptoms there are new, worsening or other concerning changes, return to the emergency department.

## 2022-11-05 NOTE — ED Provider Notes (Signed)
[facility]-EMERGENCY DEPT Provider Note   CSN: [id number] Arrival date & time: 11/05/22  0720     History  Chief Complaint  Patient presents with   Emesis   Rectal Bleeding   Abdominal Pain    Carla Morris is a 41 y.o. female.  HPI Patient reports she felt well yesterday morning.  She ate a frozen meal with ravioli and red sauce.  Several hours later she started having intense abdominal cramping followed by bright red bloody bowel movement.  She reports when she looked the entirety of the movement was all red blood.  Throughout the night now she has persisted in having intense lower abdominal cramping with associated nausea.  She did vomit several times as well.  She reports at this point she feels very weak and lightheaded.  She is having hot and cold episodes.  Patient does not have any travel history, she works as a Engineer, structural in an outpatient medical setting.  No recent antibiotic therapy.  [place]    Home Medications Prior to Admission medications   Medication Sig Start Date End Date Taking? Authorizing Provider  promethazine (PHENERGAN) 25 MG suppository Place 1 suppository (25 mg total) rectally every 6 (six) hours as needed for nausea or vomiting. 11/05/22  Yes Arby Barrette, MD  promethazine (PHENERGAN) 25 MG tablet Take 1 tablet (25 mg total) by mouth every 6 (six) hours as needed for nausea or vomiting. 11/05/22  Yes Korah Hufstedler, Lebron Conners, MD  ALPRAZolam (XANAX) 0.5 MG tablet Take 1 tablet (0.5 mg total) by mouth 2 (two) times daily as needed for anxiety. 10/22/22   Monica Becton, MD  erythromycin ophthalmic ointment Place 1 Application into the left eye in the morning and at bedtime. 10/25/22   Margaretann Loveless, PA-C  metFORMIN (GLUCOPHAGE-XR) 750 MG 24 hr tablet Take 1 tablet (750 mg total) by mouth daily with breakfast. 06/30/22   Monica Becton, MD  methocarbamol (ROBAXIN) 500 MG tablet Take 1  tablet (500 mg total) by mouth 3 (three) times daily. 03/24/22   Monica Becton, MD  spironolactone (ALDACTONE) 25 MG tablet Take 1 tablet (25 mg total) by mouth daily. 06/30/22   Monica Becton, MD  WEGOVY 2.4 MG/0.75ML SOAJ Inject 2.4 mg into the skin once a week. 10/30/22   Monica Becton, MD      Allergies    Aspirin    Review of Systems   Review of Systems  Physical Exam Updated Vital Signs BP 118/82 (BP Location: Right Arm)   Pulse 93   Temp 98.7 F (37.1 C) (Oral)   Resp 16   Ht 5\' 5"  (1.651 m)   Wt 80.3 kg   LMP 11/02/2022   SpO2 99%   BMI 29.45 kg/m  Physical Exam Constitutional:      Comments: Well-nourished well-developed.  Very uncomfortable in appearance.  Alert with clear mental status no respiratory distress.  HENT:     Mouth/Throat:     Pharynx: Oropharynx is clear.  Eyes:     Extraocular Movements: Extraocular movements intact.  Cardiovascular:     Rate and Rhythm: Normal rate and regular rhythm.  Pulmonary:     Effort: Pulmonary effort is normal.     Breath sounds: Normal breath sounds.  Abdominal:     Comments: Moderate diffuse lower abdominal tenderness.  No guarding.  Genitourinary:    Comments: Rectal exam has trace amounts of mucousy red material.  No formed stool in the vault. Musculoskeletal:        General: No swelling or tenderness. Normal range of motion.     Right lower leg: No edema.     Left lower leg: No edema.  Skin:    General: Skin is warm and dry.  Neurological:     General: No focal deficit present.     Mental Status: She is oriented to person, place, and time.     Coordination: Coordination normal.     ED Results / Procedures / Treatments   Labs (all labs ordered are listed, but only abnormal results are displayed) Labs Reviewed  COMPREHENSIVE METABOLIC PANEL - Abnormal; Notable for the following components:      Result Value   Glucose, Bld 148 (*)    Total Protein 8.8 (*)    All other components  within normal limits  CBC - Abnormal; Notable for the following components:   WBC 18.2 (*)    RBC 5.38 (*)    Platelets 405 (*)    All other components within normal limits  RESP PANEL BY RT-PCR (RSV, FLU A&B, COVID)  RVPGX2  GASTROINTESTINAL PANEL BY PCR, STOOL (REPLACES STOOL CULTURE)  HCG, QUANTITATIVE, PREGNANCY  PROTIME-INR  POC OCCULT BLOOD, ED  TYPE AND SCREEN    EKG None  Radiology CT Abdomen Pelvis W Contrast  Result Date: 11/05/2022 CLINICAL DATA:  Abdominal pain, acute, nonlocalized. Rectal bleeding. EXAM: CT ABDOMEN AND PELVIS WITH CONTRAST TECHNIQUE: Multidetector CT imaging of the abdomen and pelvis was performed using the standard protocol following bolus administration of intravenous contrast. RADIATION DOSE REDUCTION: This exam was performed according to the departmental dose-optimization program which includes automated exposure control, adjustment of the mA and/or kV according to patient size and/or use of iterative reconstruction technique. CONTRAST:  OMNIPAQUE IOHEXOL 300 MG/ML  SOLN COMPARISON:  CT examination dated June 29, 2017 FINDINGS: Lower chest: No acute abnormality. Bibasilar subsegmental linear atelectasis. Hepatobiliary: Hypodense structure in the inferior right hepatic lobe measuring a 4.7 x 4.0 x 3.9 cm with peripheral discontinuous enhancement suggesting cavernous hemangioma, not significantly changed from prior examination. No biliary ductal dilatation. Gallbladder is normal without evidence of gallstones or gallbladder wall thickening. Pancreas: Unremarkable. No pancreatic ductal dilatation or surrounding inflammatory changes. Spleen: Normal in size without focal abnormality. Adrenals/Urinary Tract: Adrenal glands are unremarkable. Kidneys are normal, without renal calculi, focal lesion, or hydronephrosis. Bladder is unremarkable. Stomach/Bowel: Stomach is within normal limits. Appendix appears normal. No evidence of bowel wall thickening, distention,  or inflammatory changes. Vascular/Lymphatic: No significant vascular findings are present. No enlarged abdominal or pelvic lymph nodes. Reproductive: Small nabothian cysts are again noted. Uterus and adnexa are otherwise unremarkable. Other: No abdominal wall hernia or abnormality. No abdominopelvic ascites. Musculoskeletal: No acute or significant osseous findings. IMPRESSION: 1. No CT evidence of acute abdominal/pelvic process. 2. No evidence of nephrolithiasis or hydronephrosis. 3. Right hepatic cavernous hemangioma, measuring 4.7 x 4.0 x 3.9 cm, not significantly changed. 4. Bowel loops are normal in caliber. No evidence of colitis or diverticulitis. Normal appendix. 5. Uterus and adnexa are unremarkable. Electronically Signed   By: Larose Hires D.O.   On: 11/05/2022 14:36    Procedures Procedures    Medications Ordered in ED Medications  lactated ringers infusion (0 mLs Intravenous Stopped 11/05/22 1555)  lactated ringers bolus 1,000 mL (0 mLs Intravenous Stopped 11/05/22 1104)  ondansetron (ZOFRAN) injection 4 mg (4 mg Intravenous Given 11/05/22 0904)  promethazine (PHENERGAN) 25 mg in sodium  chloride 0.9 % 1,000 mL infusion (0 mg Intravenous Stopped 11/05/22 1135)  lactated ringers bolus 1,000 mL (0 mLs Intravenous Stopped 11/05/22 1306)  iohexol (OMNIPAQUE) 300 MG/ML solution 100 mL (100 mLs Intravenous Contrast Given 11/05/22 1413)    ED Course/ Medical Decision Making/ A&P                           Medical Decision Making Amount and/or Complexity of Data Reviewed Labs: ordered. Radiology: ordered.  Risk Prescription drug management.   Patient presents with an abrupt onset of bloody diarrhea.  He has a lot of lower abdominal cramping.  Hot and cold episodes.  No medical history for GI disorder.  At this time differential diagnosis includes foodborne gastroenteritis\inflammatory bowel disorder.  Will proceed with fluid resuscitation with lactated Ringer's and Zofran for nausea.   Patient reports in the past she has had difficulty tolerating narcotic pain medications getting nausea and pallor.  We agreed on plan of first hydration and nausea control with reassessment then pain control if still needed.  Labs normal BUN and creatinine.  Patient does have leukocytosis at 18,000.  hemoglobin is 14.7.   CT scan obtained.  No acute findings.  After 2 L of hydration, IV Zofran and Phenergan, patient is feeling improved.  She has been able to start taking oral intake.  At this time with symptomatic improvement, no obstruction or significant inflammatory findings on CT and hemoglobin within normal limits, will plan for discharge with continued home management with hydration, Zofran and Phenergan as needed.  Return precautions reviewed.  Stool panel pending for bloody diarrhea.  She has not however continued to have more bloody diarrhea in the emergency department.  Not have apparent risk factors of recent travel, well water or C. difficile exposure.  Will not start empiric antibiotics at this time.         Final Clinical Impression(s) / ED Diagnoses Final diagnoses:  Gastroenteritis  Bloody diarrhea    Rx / DC Orders ED Discharge Orders          Ordered    promethazine (PHENERGAN) 25 MG tablet  Every 6 hours PRN        11/05/22 1549    promethazine (PHENERGAN) 25 MG suppository  Every 6 hours PRN        11/05/22 1549              Arby Barrette, MD 11/05/22 1600

## 2022-11-06 LAB — GASTROINTESTINAL PANEL BY PCR, STOOL (REPLACES STOOL CULTURE)

## 2022-12-11 ENCOUNTER — Other Ambulatory Visit (HOSPITAL_BASED_OUTPATIENT_CLINIC_OR_DEPARTMENT_OTHER): Payer: Self-pay

## 2023-01-17 ENCOUNTER — Encounter: Payer: Self-pay | Admitting: Sports Medicine

## 2023-02-08 ENCOUNTER — Other Ambulatory Visit: Payer: Self-pay

## 2023-02-09 ENCOUNTER — Other Ambulatory Visit (HOSPITAL_COMMUNITY): Payer: Self-pay

## 2023-02-09 ENCOUNTER — Other Ambulatory Visit: Payer: Self-pay

## 2023-02-09 ENCOUNTER — Other Ambulatory Visit (HOSPITAL_BASED_OUTPATIENT_CLINIC_OR_DEPARTMENT_OTHER): Payer: Self-pay

## 2023-02-19 ENCOUNTER — Other Ambulatory Visit (HOSPITAL_COMMUNITY): Payer: Self-pay

## 2023-03-12 ENCOUNTER — Other Ambulatory Visit (HOSPITAL_COMMUNITY): Payer: Self-pay

## 2023-03-27 ENCOUNTER — Other Ambulatory Visit (HOSPITAL_BASED_OUTPATIENT_CLINIC_OR_DEPARTMENT_OTHER): Payer: Self-pay

## 2023-03-27 ENCOUNTER — Encounter: Payer: Self-pay | Admitting: Sports Medicine

## 2023-03-27 ENCOUNTER — Ambulatory Visit (INDEPENDENT_AMBULATORY_CARE_PROVIDER_SITE_OTHER): Payer: 59 | Admitting: Sports Medicine

## 2023-03-27 ENCOUNTER — Other Ambulatory Visit: Payer: Self-pay | Admitting: Sports Medicine

## 2023-03-27 VITALS — BP 104/70 | HR 81 | Ht 65.0 in | Wt 162.0 lb

## 2023-03-27 DIAGNOSIS — E6609 Other obesity due to excess calories: Secondary | ICD-10-CM | POA: Diagnosis not present

## 2023-03-27 DIAGNOSIS — F32A Depression, unspecified: Secondary | ICD-10-CM | POA: Diagnosis not present

## 2023-03-27 DIAGNOSIS — F419 Anxiety disorder, unspecified: Secondary | ICD-10-CM

## 2023-03-27 DIAGNOSIS — E782 Mixed hyperlipidemia: Secondary | ICD-10-CM | POA: Diagnosis not present

## 2023-03-27 DIAGNOSIS — E559 Vitamin D deficiency, unspecified: Secondary | ICD-10-CM

## 2023-03-27 DIAGNOSIS — Z8669 Personal history of other diseases of the nervous system and sense organs: Secondary | ICD-10-CM | POA: Diagnosis not present

## 2023-03-27 DIAGNOSIS — L988 Other specified disorders of the skin and subcutaneous tissue: Secondary | ICD-10-CM | POA: Insufficient documentation

## 2023-03-27 DIAGNOSIS — E538 Deficiency of other specified B group vitamins: Secondary | ICD-10-CM | POA: Diagnosis not present

## 2023-03-27 DIAGNOSIS — Z0000 Encounter for general adult medical examination without abnormal findings: Secondary | ICD-10-CM | POA: Diagnosis not present

## 2023-03-27 MED ORDER — RIZATRIPTAN BENZOATE 5 MG PO TABS
5.0000 mg | ORAL_TABLET | ORAL | 0 refills | Status: DC | PRN
  Filled 2023-03-27: qty 18, 30d supply, fill #0

## 2023-03-27 MED ORDER — SEMAGLUTIDE (2 MG/DOSE) 8 MG/3ML ~~LOC~~ SOPN: PEN_INJECTOR | SUBCUTANEOUS | 3 refills | Status: DC

## 2023-03-27 MED ORDER — RIZATRIPTAN BENZOATE 5 MG PO TABS
5.0000 mg | ORAL_TABLET | ORAL | 0 refills | Status: DC | PRN
  Filled 2023-03-27: qty 10, 10d supply, fill #0

## 2023-03-27 MED ORDER — BUPROPION HCL ER (XL) 150 MG PO TB24
150.0000 mg | ORAL_TABLET | ORAL | 3 refills | Status: DC
  Filled 2023-03-27: qty 30, 30d supply, fill #0
  Filled 2023-04-20: qty 30, 30d supply, fill #1
  Filled 2023-05-21: qty 30, 30d supply, fill #2
  Filled 2023-07-25: qty 30, 30d supply, fill #3

## 2023-03-27 MED ORDER — TRETINOIN 0.1 % EX CREA
TOPICAL_CREAM | Freq: Every day | CUTANEOUS | 11 refills | Status: AC
  Filled 2023-03-27: qty 45, 30d supply, fill #0
  Filled 2023-12-30: qty 45, 30d supply, fill #1

## 2023-03-27 NOTE — Assessment & Plan Note (Signed)
Current life stressors, adding behavioral therapy, Wellbutrin, does not desire SSRIs. Return to see me in 6 weeks to reevaluate.

## 2023-03-27 NOTE — Assessment & Plan Note (Signed)
History of migraines, classic in symptomatology. Now with recurrent life stressors she is having increasing migraines, adding Maxalt to abort migraines and she will need some FMLA leave.

## 2023-03-27 NOTE — Assessment & Plan Note (Signed)
Good results with topical tretinoin, refilling.

## 2023-03-27 NOTE — Assessment & Plan Note (Signed)
Good weight loss on Wegovy but no longer covered by her insurance, adding compounded semaglutide.

## 2023-03-27 NOTE — Progress Notes (Signed)
Subjective:    CC: Annual Physical Exam  HPI:  This patient is here for their annual physical  I reviewed the past medical history, family history, social history, surgical history, and allergies today and no changes were needed.  Please see the problem list section below in epic for further details.  Past Medical History: Past Medical History:  Diagnosis Date   Anxiety    Asthma    Pars defect    Past Surgical History: Past Surgical History:  Procedure Laterality Date   NO PAST SURGERIES     Social History: Social History   Socioeconomic History   Marital status: Significant Other    Spouse name: Not on file   Number of children: Not on file   Years of education: Not on file   Highest education level: Not on file  Occupational History   Not on file  Tobacco Use   Smoking status: Never   Smokeless tobacco: Never  Vaping Use   Vaping Use: Never used  Substance and Sexual Activity   Alcohol use: No   Drug use: No   Sexual activity: Never  Other Topics Concern   Not on file  Social History Narrative   Full time student/plan to enter PA Program/currently [employer]   1 son: 5 yrs old   Regular exercise: some   Caffeine use: 1 cup of coffee daily   Social Determinants of Health   Financial Resource Strain: Not on file  Food Insecurity: Not on file  Transportation Needs: Not on file  Physical Activity: Not on file  Stress: Not on file  Social Connections: Not on file   Family History: No family history on file. Allergies: Allergies  Allergen Reactions   Aspirin Other (See Comments)    Severe migraines    Medications: See med rec.  Review of Systems: No headache, visual changes, nausea, vomiting, diarrhea, constipation, dizziness, abdominal pain, skin rash, fevers, chills, night sweats, swollen lymph nodes, weight loss, chest pain, body aches, joint swelling, muscle aches, shortness of breath, mood changes, visual or auditory hallucinations.  Objective:     General: Well Developed, well nourished, and in no acute distress.  Neuro: Alert and oriented x3, extra-ocular muscles intact, sensation grossly intact. Cranial nerves II through XII are intact, motor, sensory, and coordinative functions are all intact. HEENT: Normocephalic, atraumatic, pupils equal round reactive to light, neck supple, no masses, no lymphadenopathy, thyroid nonpalpable. Oropharynx, nasopharynx, external ear canals are unremarkable. Skin: Warm and dry, no rashes noted.  Cardiac: Regular rate and rhythm, no murmurs rubs or gallops.  Respiratory: Clear to auscultation bilaterally. Not using accessory muscles, speaking in full sentences.  Abdominal: Soft, nontender, nondistended, positive bowel sounds, no masses, no organomegaly.  Musculoskeletal: Shoulder, elbow, wrist, hip, knee, ankle stable, and with full range of motion.  Impression and Recommendations:    The patient was counselled, risk factors were discussed, anticipatory guidance given.  Annual physical exam Annual physical as above, up to date on screenings.   Anxiety and depression Current life stressors, adding behavioral therapy, Wellbutrin, does not desire SSRIs. Return to see me in 6 weeks to reevaluate.  Obesity Good weight loss on Wegovy but no longer covered by her insurance, adding compounded semaglutide.  History of migraine headaches History of migraines, classic in symptomatology. Now with recurrent life stressors she is having increasing migraines, adding Maxalt to abort migraines and she will need some FMLA leave.  Wrinkles Good results with topical tretinoin, refilling.   ____________________________________________ Ihor Austin.  Dianah Field, M.D., ABFM., CAQSM., AME. Primary Care and Sports Medicine [facility]  Adjunct Professor of [employer]  Risk manager

## 2023-03-27 NOTE — Assessment & Plan Note (Signed)
Annual physical as above, up-to-date on screenings. 

## 2023-03-28 ENCOUNTER — Other Ambulatory Visit (HOSPITAL_BASED_OUTPATIENT_CLINIC_OR_DEPARTMENT_OTHER): Payer: Self-pay

## 2023-03-28 LAB — COMPREHENSIVE METABOLIC PANEL
AG Ratio: 1.5 (calc) (ref 1.0–2.5)
ALT: 13 U/L (ref 6–29)
AST: 13 U/L (ref 10–30)
Albumin: 4.5 g/dL (ref 3.6–5.1)
Alkaline phosphatase (APISO): 53 U/L (ref 31–125)
BUN: 11 mg/dL (ref 7–25)
CO2: 26 mmol/L (ref 20–32)
Calcium: 9.6 mg/dL (ref 8.6–10.2)
Chloride: 104 mmol/L (ref 98–110)
Creat: 0.56 mg/dL (ref 0.50–0.99)
Globulin: 3.1 g/dL (calc) (ref 1.9–3.7)
Glucose, Bld: 83 mg/dL (ref 65–99)
Potassium: 4.8 mmol/L (ref 3.5–5.3)
Sodium: 139 mmol/L (ref 135–146)
Total Bilirubin: 0.6 mg/dL (ref 0.2–1.2)
Total Protein: 7.6 g/dL (ref 6.1–8.1)

## 2023-03-28 LAB — CBC
HCT: 40.3 % (ref 35.0–45.0)
Hemoglobin: 13.5 g/dL (ref 11.7–15.5)
MCH: 27.3 pg (ref 27.0–33.0)
MCHC: 33.5 g/dL (ref 32.0–36.0)
MCV: 81.6 fL (ref 80.0–100.0)
MPV: 10.8 fL (ref 7.5–12.5)
Platelets: 307 10*3/uL (ref 140–400)
RBC: 4.94 10*6/uL (ref 3.80–5.10)
RDW: 12.9 % (ref 11.0–15.0)
WBC: 7.2 10*3/uL (ref 3.8–10.8)

## 2023-03-28 LAB — LIPID PANEL
Cholesterol: 204 mg/dL — ABNORMAL HIGH (ref <200)
HDL: 56 mg/dL (ref >=50)
LDL Cholesterol (Calc): 128 mg/dL (calc) — ABNORMAL HIGH
Non-HDL Cholesterol (Calc): 148 mg/dL (calc) — ABNORMAL HIGH (ref <130)
Total CHOL/HDL Ratio: 3.6 (calc) (ref <5.0)
Triglycerides: 94 mg/dL (ref <150)

## 2023-03-28 LAB — HEMOGLOBIN A1C
Hgb A1c MFr Bld: 5.3 % of total Hgb (ref <5.7)
Mean Plasma Glucose: 105 mg/dL
eAG (mmol/L): 5.8 mmol/L

## 2023-03-28 LAB — VITAMIN B12: Vitamin B-12: 423 pg/mL (ref 200–1100)

## 2023-03-28 LAB — TSH: TSH: 0.88 mIU/L

## 2023-03-28 LAB — VITAMIN D 25 HYDROXY (VIT D DEFICIENCY, FRACTURES): Vit D, 25-Hydroxy: 30 ng/mL (ref 30–100)

## 2023-04-02 ENCOUNTER — Encounter: Payer: Self-pay | Admitting: Sports Medicine

## 2023-04-02 NOTE — Telephone Encounter (Signed)
Appointment needed for [facility] and/or disability paperwork

## 2023-04-03 NOTE — Telephone Encounter (Signed)
Carla Morris does have the paperwork, she can do a virtual visit for this if she would like but I need her to help fill out the dates.

## 2023-04-10 ENCOUNTER — Ambulatory Visit (INDEPENDENT_AMBULATORY_CARE_PROVIDER_SITE_OTHER): Payer: 59 | Admitting: Sports Medicine

## 2023-04-10 DIAGNOSIS — Z8669 Personal history of other diseases of the nervous system and sense organs: Secondary | ICD-10-CM | POA: Diagnosis not present

## 2023-04-10 NOTE — Assessment & Plan Note (Signed)
Maxalt seems to work, we filled out her FMLA paperwork today, return to see me as needed for this.

## 2023-04-10 NOTE — Progress Notes (Signed)
    Procedures performed today:    None.  Independent interpretation of notes and tests performed by another provider:   None.  Brief History, Exam, Impression, and Recommendations:    History of migraine headaches Maxalt seems to work, we filled out her FMLA paperwork today, return to see me as needed for this.    ____________________________________________ Ihor Austin. Benjamin Stain, M.D., ABFM., CAQSM., AME. Primary Care and Sports Medicine  [facility]  Adjunct Professor of Family Medicine  [employer]  Restaurant manager, fast food

## 2023-05-08 ENCOUNTER — Ambulatory Visit: Payer: 59 | Admitting: Sports Medicine

## 2023-05-16 ENCOUNTER — Ambulatory Visit: Payer: 59 | Admitting: Professional

## 2023-05-16 ENCOUNTER — Ambulatory Visit: Payer: 59 | Admitting: Sports Medicine

## 2023-05-23 ENCOUNTER — Telehealth: Payer: 59 | Admitting: Family Medicine

## 2023-05-23 DIAGNOSIS — R3989 Other symptoms and signs involving the genitourinary system: Secondary | ICD-10-CM | POA: Diagnosis not present

## 2023-05-23 MED ORDER — CEPHALEXIN 500 MG PO CAPS: 500.0000 mg | ORAL_CAPSULE | Freq: Two times a day (BID) | ORAL | 0 refills | Status: AC

## 2023-05-23 NOTE — Progress Notes (Signed)

## 2023-06-01 ENCOUNTER — Other Ambulatory Visit: Payer: Self-pay | Admitting: Oncology

## 2023-06-01 DIAGNOSIS — Z006 Encounter for examination for normal comparison and control in clinical research program: Secondary | ICD-10-CM

## 2023-09-02 ENCOUNTER — Other Ambulatory Visit: Payer: Self-pay | Admitting: Sports Medicine

## 2023-09-02 DIAGNOSIS — F32A Depression, unspecified: Secondary | ICD-10-CM

## 2023-09-02 MED ORDER — BUPROPION HCL ER (XL) 150 MG PO TB24
150.0000 mg | ORAL_TABLET | ORAL | 3 refills | Status: AC
  Filled 2023-09-02: qty 30, 30d supply, fill #0
  Filled 2023-12-30: qty 30, 30d supply, fill #1
  Filled 2024-05-29: qty 30, 30d supply, fill #2

## 2023-09-03 ENCOUNTER — Other Ambulatory Visit: Payer: Self-pay

## 2023-09-03 ENCOUNTER — Other Ambulatory Visit (HOSPITAL_COMMUNITY): Payer: Self-pay

## 2023-09-14 DIAGNOSIS — F330 Major depressive disorder, recurrent, mild: Secondary | ICD-10-CM | POA: Diagnosis not present

## 2023-09-14 DIAGNOSIS — F411 Generalized anxiety disorder: Secondary | ICD-10-CM | POA: Diagnosis not present

## 2023-09-20 DIAGNOSIS — F330 Major depressive disorder, recurrent, mild: Secondary | ICD-10-CM | POA: Diagnosis not present

## 2023-09-20 DIAGNOSIS — F411 Generalized anxiety disorder: Secondary | ICD-10-CM | POA: Diagnosis not present

## 2023-09-21 ENCOUNTER — Telehealth: Payer: 59 | Admitting: Physician Assistant

## 2023-09-21 DIAGNOSIS — N760 Acute vaginitis: Secondary | ICD-10-CM | POA: Diagnosis not present

## 2023-09-21 DIAGNOSIS — B9689 Other specified bacterial agents as the cause of diseases classified elsewhere: Secondary | ICD-10-CM

## 2023-09-21 MED ORDER — METRONIDAZOLE 500 MG PO TABS: 500.0000 mg | ORAL_TABLET | Freq: Two times a day (BID) | ORAL | 0 refills | Status: AC

## 2023-09-21 NOTE — Progress Notes (Signed)
E-Visit for Vaginal Symptoms  We are sorry that you are not feeling well. Here is how we plan to help! Based on what you shared with me it looks like you: May have a vaginosis due to bacteria  Vaginosis is an inflammation of the vagina that can result in discharge, itching and pain. The cause is usually a change in the normal balance of vaginal bacteria or an infection. Vaginosis can also result from reduced estrogen levels after menopause.  The most common causes of vaginosis are:   Bacterial vaginosis which results from an overgrowth of one on several organisms that are normally present in your vagina.   Yeast infections which are caused by a naturally occurring fungus called candida.   Vaginal atrophy (atrophic vaginosis) which results from the thinning of the vagina from reduced estrogen levels after menopause.   Trichomoniasis which is caused by a parasite and is commonly transmitted by sexual intercourse.  Factors that increase your risk of developing vaginosis include: Medications, such as antibiotics and steroids Uncontrolled diabetes Use of hygiene products such as bubble bath, vaginal spray or vaginal deodorant Douching Wearing damp or tight-fitting clothing Using an intrauterine device (IUD) for birth control Hormonal changes, such as those associated with pregnancy, birth control pills or menopause Sexual activity Having a sexually transmitted infection  Your treatment plan is Metronidazole or Flagyl 500mg twice a day for 7 days.  I have electronically sent this prescription into the pharmacy that you have chosen.  Be sure to take all of the medication as directed. Stop taking any medication if you develop a rash, tongue swelling or shortness of breath. Mothers who are breast feeding should consider pumping and discarding their breast milk while on these antibiotics. However, there is no consensus that infant exposure at these doses would be harmful.  Remember that  medication creams can weaken latex condoms. .   HOME CARE:  Good hygiene may prevent some types of vaginosis from recurring and may relieve some symptoms:  Avoid baths, hot tubs and whirlpool spas. Rinse soap from your outer genital area after a shower, and dry the area well to prevent irritation. Don't use scented or harsh soaps, such as those with deodorant or antibacterial action. Avoid irritants. These include scented tampons and pads. Wipe from front to back after using the toilet. Doing so avoids spreading fecal bacteria to your vagina.  Other things that may help prevent vaginosis include:  Don't douche. Your vagina doesn't require cleansing other than normal bathing. Repetitive douching disrupts the normal organisms that reside in the vagina and can actually increase your risk of vaginal infection. Douching won't clear up a vaginal infection. Use a latex condom. Both female and female latex condoms may help you avoid infections spread by sexual contact. Wear cotton underwear. Also wear pantyhose with a cotton crotch. If you feel comfortable without it, skip wearing underwear to bed. Yeast thrives in moist environments Your symptoms should improve in the next day or two.  GET HELP RIGHT AWAY IF:  You have pain in your lower abdomen ( pelvic area or over your ovaries) You develop nausea or vomiting You develop a fever Your discharge changes or worsens You have persistent pain with intercourse You develop shortness of breath, a rapid pulse, or you faint.  These symptoms could be signs of problems or infections that need to be evaluated by a medical provider now.  MAKE SURE YOU   Understand these instructions. Will watch your condition. Will get help right   away if you are not doing well or get worse.  Thank you for choosing an e-visit.  Your e-visit answers were reviewed by a board certified advanced clinical practitioner to complete your personal care plan. Depending upon the  condition, your plan could have included both over the counter or prescription medications.  Please review your pharmacy choice. Make sure the pharmacy is open so you can pick up prescription now. If there is a problem, you may contact your provider through MyChart messaging and have the prescription routed to another pharmacy.  Your safety is important to us. If you have drug allergies check your prescription carefully.   For the next 24 hours you can use MyChart to ask questions about today's visit, request a non-urgent call back, or ask for a work or school excuse. You will get an email in the next two days asking about your experience. I hope that your e-visit has been valuable and will speed your recovery.  I have spent 5 minutes in review of e-visit questionnaire, review and updating patient chart, medical decision making and response to patient.   Siddalee Vanderheiden M Lujean Ebright, PA-C  

## 2023-10-03 DIAGNOSIS — F330 Major depressive disorder, recurrent, mild: Secondary | ICD-10-CM | POA: Diagnosis not present

## 2023-10-03 DIAGNOSIS — F411 Generalized anxiety disorder: Secondary | ICD-10-CM | POA: Diagnosis not present

## 2023-10-11 ENCOUNTER — Telehealth: Payer: 59 | Admitting: Physician Assistant

## 2023-10-11 DIAGNOSIS — B379 Candidiasis, unspecified: Secondary | ICD-10-CM | POA: Diagnosis not present

## 2023-10-11 DIAGNOSIS — R3989 Other symptoms and signs involving the genitourinary system: Secondary | ICD-10-CM

## 2023-10-11 MED ORDER — FLUCONAZOLE 150 MG PO TABS: ORAL_TABLET | ORAL | 0 refills | Status: DC

## 2023-10-11 MED ORDER — NITROFURANTOIN MONOHYD MACRO 100 MG PO CAPS: 100.0000 mg | ORAL_CAPSULE | Freq: Two times a day (BID) | ORAL | 0 refills | Status: AC

## 2023-10-11 NOTE — Progress Notes (Signed)
E-Visit for Urinary Problems  We are sorry that you are not feeling well.  Here is how we plan to help!  Based on what you shared with me it looks like you most likely have a simple urinary tract infection.  A UTI (Urinary Tract Infection) is a bacterial infection of the bladder.  Most cases of urinary tract infections are simple to treat but a key part of your care is to encourage you to drink plenty of fluids and watch your symptoms carefully.  I have prescribed MacroBid 100 mg twice a day for 5 days.  I've also prescribed Fluconazole take one tablet once and if no improvement of your symptoms then take the second talbet 72 hours later. Your symptoms should gradually improve. Call us if the burning in your urine worsens, you develop worsening fever, back pain or pelvic pain or if your symptoms do not resolve after completing the antibiotic.  Urinary tract infections can be prevented by drinking plenty of water to keep your body hydrated.  Also be sure when you wipe, wipe from front to back and don't hold it in!  If possible, empty your bladder every 4 hours.  HOME CARE Drink plenty of fluids Compete the full course of the antibiotics even if the symptoms resolve Remember, when you need to go.go. Holding in your urine can increase the likelihood of getting a UTI! GET HELP RIGHT AWAY IF: You cannot urinate You get a high fever Worsening back pain occurs You see blood in your urine You feel sick to your stomach or throw up You feel like you are going to pass out  MAKE SURE YOU  Understand these instructions. Will watch your condition. Will get help right away if you are not doing well or get worse.   Thank you for choosing an e-visit.  Your e-visit answers were reviewed by a board certified advanced clinical practitioner to complete your personal care plan. Depending upon the condition, your plan could have included both over the counter or prescription medications.  Please review  your pharmacy choice. Make sure the pharmacy is open so you can pick up prescription now. If there is a problem, you may contact your provider through [employer] and have the prescription routed to another pharmacy.  Your safety is important to [place]. If you have drug allergies check your prescription carefully.   For the next 24 hours you can use MyChart to ask questions about today's visit, request a non-urgent call back, or ask for a work or school excuse. You will get an email in the next two days asking about your experience. I hope that your e-visit has been valuable and will speed your recovery.   I have spent 5 minutes in review of e-visit questionnaire, review and updating patient chart, medical decision making and response to patient.   Gilberto Better, PA-C

## 2023-11-08 ENCOUNTER — Telehealth: Payer: 59 | Admitting: Nurse Practitioner

## 2023-11-08 DIAGNOSIS — J0140 Acute pansinusitis, unspecified: Secondary | ICD-10-CM | POA: Diagnosis not present

## 2023-11-08 MED ORDER — AMOXICILLIN-POT CLAVULANATE 875-125 MG PO TABS: 1.0000 | ORAL_TABLET | Freq: Two times a day (BID) | ORAL | 0 refills | Status: AC

## 2023-11-08 NOTE — Progress Notes (Signed)
E-Visit for Sinus Problems  We are sorry that you are not feeling well.  Here is how we plan to help!  Based on what you have shared with me it looks like you have sinusitis.  Sinusitis is inflammation and infection in the sinus cavities of the head.  Based on your presentation I believe you most likely have Acute Bacterial Sinusitis.  This is an infection caused by bacteria and is treated with antibiotics. I have prescribed Augmentin 875mg/125mg one tablet twice daily with food, for 7 days.  You may use an oral decongestant such as Mucinex D or if you have glaucoma or high blood pressure use plain Mucinex. Saline nasal spray help and can safely be used as often as needed for congestion.  If you develop worsening sinus pain, fever or notice severe headache and vision changes, or if symptoms are not better after completion of antibiotic, please schedule an appointment with a health care provider.    Sinus infections are not as easily transmitted as other respiratory infection, however we still recommend that you avoid close contact with loved ones, especially the very young and elderly.  Remember to wash your hands thoroughly throughout the day as this is the number one way to prevent the spread of infection!  Home Care: Only take medications as instructed by your medical team. Complete the entire course of an antibiotic. Do not take these medications with alcohol. A steam or ultrasonic humidifier can help congestion.  You can place a towel over your head and breathe in the steam from hot water coming from a faucet. Avoid close contacts especially the very young and the elderly. Cover your mouth when you cough or sneeze. Always remember to wash your hands.  Get Help Right Away If: You develop worsening fever or sinus pain. You develop a severe head ache or visual changes. Your symptoms persist after you have completed your treatment plan.  Make sure you Understand these instructions. Will  watch your condition. Will get help right away if you are not doing well or get worse.  Thank you for choosing an e-visit.  Your e-visit answers were reviewed by a board certified advanced clinical practitioner to complete your personal care plan. Depending upon the condition, your plan could have included both over the counter or prescription medications.  Please review your pharmacy choice. Make sure the pharmacy is open so you can pick up prescription now. If there is a problem, you may contact your provider through MyChart messaging and have the prescription routed to another pharmacy.  Your safety is important to us. If you have drug allergies check your prescription carefully.   For the next 24 hours you can use MyChart to ask questions about today's visit, request a non-urgent call back, or ask for a work or school excuse. You will get an email in the next two days asking about your experience. I hope that your e-visit has been valuable and will speed your recovery.   Meds ordered this encounter  Medications   amoxicillin-clavulanate (AUGMENTIN) 875-125 MG tablet    Sig: Take 1 tablet by mouth 2 (two) times daily for 7 days.    Dispense:  14 tablet    Refill:  0     I spent approximately 5 minutes reviewing the patient's history, current symptoms and coordinating their care today.   

## 2023-11-09 MED ORDER — FLUCONAZOLE 150 MG PO TABS: 150.0000 mg | ORAL_TABLET | Freq: Once | ORAL | 0 refills | Status: AC

## 2023-11-09 NOTE — Addendum Note (Signed)
Addended by: Margaretann Loveless on: 11/09/2023 09:13 AM   Modules accepted: Orders

## 2023-12-02 ENCOUNTER — Encounter: Payer: Self-pay | Admitting: Obstetrics & Gynecology

## 2023-12-12 ENCOUNTER — Telehealth: Payer: Commercial Managed Care - PPO | Admitting: Physician Assistant

## 2023-12-12 DIAGNOSIS — J4521 Mild intermittent asthma with (acute) exacerbation: Secondary | ICD-10-CM | POA: Diagnosis not present

## 2023-12-12 MED ORDER — PREDNISONE 20 MG PO TABS: 40.0000 mg | ORAL_TABLET | Freq: Every day | ORAL | 0 refills | Status: DC

## 2023-12-12 MED ORDER — ALBUTEROL SULFATE HFA 108 (90 BASE) MCG/ACT IN AERS: 2.0000 | INHALATION_SPRAY | Freq: Four times a day (QID) | RESPIRATORY_TRACT | 0 refills | Status: DC | PRN

## 2023-12-12 NOTE — Progress Notes (Signed)
I have spent 5 minutes in review of e-visit questionnaire, review and updating patient chart, medical decision making and response to patient.   Mia Milan Cody Jacklynn Dehaas, PA-C    

## 2023-12-12 NOTE — Progress Notes (Signed)
E Visit for Asthma  Based on what you have shared with me, it looks like you may have a flare up of your asthma.  Asthma is a chronic (ongoing) lung disease which results in airway obstruction, inflammation and hyper-responsiveness.   Asthma symptoms vary from person to person, with common symptoms including nighttime awakening and decreased ability to participate in normal activities as a result of shortness of breath. It is often triggered by changes in weather, changes in the season, changes in air temperature, or inside (home, school, daycare or work) allergens such as animal dander, mold, mildew, woodstoves or cockroaches.   It can also be triggered by hormonal changes, extreme emotion, physical exertion or an upper respiratory tract illness.     It is important to identify the trigger, and then eliminate or avoid the trigger if possible.   If you have been prescribed medications to be taken on a regular basis, it is important to follow the asthma action plan and to follow guidelines to adjust medication in response to increasing symptoms of decreased peak expiratory flow rate  Treatment: I have prescribed: Albuterol (Proventil HFA; Ventolin HFA) 108 (90 Base) MCG/ACT Inhaler 2 puffs into the lungs every six hours as needed for wheezing or shortness of breath and Prednisone 40mg by mouth per day for 5 - 7 days  HOME CARE . Only take medications as instructed by your medical team. . Consider wearing a mask or scarf to improve breathing air temperature have been shown to decrease irritation and decrease exacerbations . Get rest. . Taking a steamy shower or using a humidifier may help nasal congestion sand ease sore throat pain. You can place a towel over your head and breathe in the steam from hot water coming from a faucet. . Using a saline nasal spray works much the same way.   . Cough drops, hare candies and sore throat lozenges may ease your cough.  . Avoid close contacts especially the very you and the elderly . Cover your mouth if you cough or sneeze . Always remember to wash your hands.    GET HELP RIGHT AWAY IF: . You develop worsening symptoms; breathlessness at rest, drowsy, confused or agitated, unable to speak in full sentences . You have coughing fits . You develop a severe headache or visual changes . You develop shortness of breath, difficulty breathing or start having chest pain . Your symptoms persist after you have completed your treatment plan . If your symptoms do not improve within 10 days  MAKE SURE YOU . Understand these instructions. . Will watch your condition. . Will get help right away if you are not doing well or get worse.   Your e-visit answers were reviewed by a board certified advanced clinical practitioner to complete your personal care plan, Depending upon the condition, your plan could have included both over the counter or prescription medications.  Please review your pharmacy choice. Your safety is important to us. If you have drug allergies check your prescription carefully. You can use MyChart to ask questions about today's visit, request a non-urgent call back, or ask for a work or school excuse for 24 hours related to this e-Visit. If it has been greater than 24 hours you will need to follow up with your provider, or enter a new e-Visit to address those concerns.  You will get an e-mail in the next two days asking about your experience. I hope that your e-visit has been valuable and will speed your   recovery. Thank you for using e-visits. 

## 2023-12-13 ENCOUNTER — Other Ambulatory Visit: Payer: Self-pay

## 2023-12-13 DIAGNOSIS — N979 Female infertility, unspecified: Secondary | ICD-10-CM

## 2023-12-30 ENCOUNTER — Other Ambulatory Visit: Payer: Self-pay | Admitting: Sports Medicine

## 2023-12-30 DIAGNOSIS — F32A Depression, unspecified: Secondary | ICD-10-CM

## 2023-12-30 DIAGNOSIS — F411 Generalized anxiety disorder: Secondary | ICD-10-CM

## 2023-12-31 ENCOUNTER — Other Ambulatory Visit (HOSPITAL_COMMUNITY): Payer: Self-pay

## 2023-12-31 MED ORDER — ALPRAZOLAM 0.5 MG PO TABS
0.5000 mg | ORAL_TABLET | Freq: Two times a day (BID) | ORAL | 3 refills | Status: AC | PRN
  Filled 2023-12-31: qty 10, 5d supply, fill #0
  Filled 2024-03-24 – 2024-04-01 (×3): qty 10, 5d supply, fill #1
  Filled 2024-05-29: qty 10, 5d supply, fill #2

## 2023-12-31 MED ORDER — RIZATRIPTAN BENZOATE 5 MG PO TABS
5.0000 mg | ORAL_TABLET | ORAL | 0 refills | Status: AC | PRN
  Filled 2023-12-31: qty 18, 30d supply, fill #0

## 2024-01-01 ENCOUNTER — Encounter: Payer: Self-pay | Admitting: Pharmacist

## 2024-01-01 ENCOUNTER — Other Ambulatory Visit (HOSPITAL_COMMUNITY): Payer: 59

## 2024-01-01 ENCOUNTER — Other Ambulatory Visit: Payer: Self-pay

## 2024-01-02 ENCOUNTER — Other Ambulatory Visit (HOSPITAL_COMMUNITY): Payer: Self-pay

## 2024-01-03 ENCOUNTER — Other Ambulatory Visit: Payer: Self-pay

## 2024-01-06 ENCOUNTER — Telehealth: Payer: Commercial Managed Care - PPO | Admitting: Nurse Practitioner

## 2024-01-06 DIAGNOSIS — K644 Residual hemorrhoidal skin tags: Secondary | ICD-10-CM

## 2024-01-06 MED ORDER — HYDROCORTISONE (PERIANAL) 2.5 % EX CREA: 1.0000 | TOPICAL_CREAM | Freq: Two times a day (BID) | CUTANEOUS | 0 refills | Status: DC

## 2024-01-06 NOTE — Progress Notes (Signed)
 I have spent 5 minutes in review of e-visit questionnaire, review and updating patient chart, medical decision making and response to patient.   Claiborne Rigg, NP

## 2024-01-06 NOTE — Progress Notes (Signed)
 We would have to recommend you be seen in person if you feel you have an abscess in your rectum. At this time we will treat for hemorrhoid.  E-Visit for Hemorrhoid  We are sorry that you are not feeling well. We are here to help!  Hemorrhoids are swollen veins in the rectum. They can cause itching, bleeding, and pain. Hemorrhoids are very common.  In some cases, you can see or feel hemorrhoids around the outside of the rectum. In other cases, you cannot see them because they are hidden inside the rectum. Be patient - It can take months for this to improve or go away.   Hemorrhoids do not always cause symptoms. But when they do, symptoms can include: ?Itching of the skin around the anus ?Bleeding - Bleeding is usually painless. You might see bright red blood after using the toilet. ?Pain - If a blood clot forms inside a hemorrhoid, this can cause pain. It can also cause a lump that you might be able to feel.   What can I do to keep from getting more hemorrhoids? -- The most important thing you can do is to keep from getting constipated. You should have a bowel movement at least a few times a week. When you have a bowel movement, you also should not have to push too much. Plus, your bowel movements should not be too hard. Being constipated and having hard bowel movements can make hemorrhoids worse.   I have prescribed Topical Hydrocortisone ointment 2.5%.  Apply to area two times per day for 30 days  HOME CARE: Sitz Baths twice daily. Soak buttocks in 2 or 3 inches of warm water for 10 to 15 minutes. Do not add soap, bubble bath, or anything to the water. Stool softener such as Colace 100 mg twice daily AND Miralax 1 scoop daily until you have regular soft stools Over the counter Preparation H Tucks Pads Witch Hazel  Here are some steps you can take to avoid getting constipated or having hard stools:  ?Eat lots of fruits, vegetables, and other foods with fiber. Fiber helps to increase  bowel movements. If you do not get enough fiber from your diet, you can take fiber supplements. These come in the form of powders, wafers, or pills. Some examples are Metamucil, Citrucel, Benefiber and FiberCon. If you take a fiber supplement, be sure to read the label so you know how much to take. If you're not sure, ask your provider or nurse. ?Take medicines called "stool softeners" such as docusate sodium (sample brand names: Colace, Dulcolax). These medicines increase the number of bowel movements you have. They are safe to take and they can prevent problems later.  You should request a referral for a follow up evaluation with a Gastroenterologist (GI doctor) to evaluate this chronic and relapsing condition - even if it improves to see what further steps need to be taken. This is highly linked to chronic constipation and straining to have a bowel movement. It may require further treatment or surgical intervention.   GET HELP RIGHT AWAY IF: You develop severe pain You have heavy bleeding   FOLLOW UP WITH YOUR PRIMARY PROVIDER IF: If your symptoms do not improve within 10 days  MAKE SURE YOU  Understand these instructions. Will watch your condition. Will get help right away if you are not doing well or get worse.   Thank you for choosing an e-visit.  Your e-visit answers were reviewed by a board certified advanced clinical practitioner  to complete your personal care plan. Depending upon the condition, your plan could have included both over the counter or prescription medications.  Please review your pharmacy choice. Make sure the pharmacy is open so you can pick up prescription now. If there is a problem, you may contact your provider through [employer] and have the prescription routed to another pharmacy.  Your safety is important to [place]. If you have drug allergies check your prescription carefully.   For the next 24 hours you can use MyChart to ask questions about today's visit,  request a non-urgent call back, or ask for a work or school excuse. You will get an email in the next two days asking about your experience. I hope that your e-visit has been valuable and will speed your recovery.

## 2024-01-08 ENCOUNTER — Encounter: Payer: Self-pay | Admitting: Sports Medicine

## 2024-01-08 DIAGNOSIS — E66811 Obesity, class 1: Secondary | ICD-10-CM

## 2024-01-08 MED ORDER — SEMAGLUTIDE (2 MG/DOSE) 8 MG/3ML ~~LOC~~ SOPN: PEN_INJECTOR | SUBCUTANEOUS | 11 refills | Status: DC

## 2024-01-14 ENCOUNTER — Ambulatory Visit: Payer: Commercial Managed Care - PPO | Admitting: Sports Medicine

## 2024-01-14 ENCOUNTER — Telehealth: Payer: Commercial Managed Care - PPO | Admitting: Sports Medicine

## 2024-01-22 ENCOUNTER — Encounter: Payer: Self-pay | Admitting: Obstetrics & Gynecology

## 2024-02-14 ENCOUNTER — Other Ambulatory Visit (HOSPITAL_BASED_OUTPATIENT_CLINIC_OR_DEPARTMENT_OTHER): Payer: Self-pay

## 2024-02-14 ENCOUNTER — Ambulatory Visit (HOSPITAL_BASED_OUTPATIENT_CLINIC_OR_DEPARTMENT_OTHER): Admitting: Student

## 2024-02-14 ENCOUNTER — Encounter (HOSPITAL_BASED_OUTPATIENT_CLINIC_OR_DEPARTMENT_OTHER): Payer: Self-pay | Admitting: Student

## 2024-02-14 ENCOUNTER — Ambulatory Visit (HOSPITAL_BASED_OUTPATIENT_CLINIC_OR_DEPARTMENT_OTHER)

## 2024-02-14 DIAGNOSIS — M25511 Pain in right shoulder: Secondary | ICD-10-CM | POA: Diagnosis not present

## 2024-02-14 DIAGNOSIS — M79601 Pain in right arm: Secondary | ICD-10-CM | POA: Diagnosis not present

## 2024-02-14 MED ORDER — LIDOCAINE HCL 1 % IJ SOLN
4.0000 mL | INTRAMUSCULAR | Status: AC | PRN
  Administered 2024-02-14: 4 mL

## 2024-02-14 MED ORDER — TRIAMCINOLONE ACETONIDE 40 MG/ML IJ SUSP
2.0000 mL | INTRAMUSCULAR | Status: AC | PRN
Start: 2024-02-14 — End: 2024-02-14
  Administered 2024-02-14: 2 mL via INTRA_ARTICULAR

## 2024-02-14 MED ORDER — TRAMADOL HCL 50 MG PO TABS
50.0000 mg | ORAL_TABLET | Freq: Four times a day (QID) | ORAL | 0 refills | Status: AC | PRN
  Filled 2024-02-14: qty 12, 3d supply, fill #0

## 2024-02-14 NOTE — Progress Notes (Signed)
 Chief Complaint: Right shoulder pain     History of Present Illness:   Discussed the use of AI scribe software for clinical note transcription with the patient, who gave verbal consent to proceed.   Carla Morris is a 43 year old left-hand-dominant female who presents with acute right shoulder pain.  The right shoulder pain began approximately three to four days ago, initially mild and noticeable when lying on her side, but has progressively worsened to become severe over the past couple of days. The pain is located on the lateral aspect of the shoulder, with some radiation down the arm. It is exacerbated by certain movements, particularly lateral positioning and pushing down on the arm. The pain is particularly severe at night, preventing sleep.  She has used ibuprofen and a muscle relaxer with minimal relief and has also tried a heating pad without much success. Being left-handed has somewhat mitigated the impact on daily activities, although she reports difficulty with tasks requiring exertion or twisting, such as opening a bottle. She avoids moving her arm due to the severity of the pain. No numbness, tingling, significant swelling, or redness is noted. The shoulder feels warm, though she did recently use a heating pad. She denies any previous injuries to the shoulder.  Surgical History:   None  PMH/PSH/Family History/Social History/Meds/Allergies:    Past Medical History:  Diagnosis Date   Anxiety    Asthma    Pars defect    Past Surgical History:  Procedure Laterality Date   NO PAST SURGERIES     Social History   Socioeconomic History   Marital status: Significant Other    Spouse name: Not on file   Number of children: Not on file   Years of education: Not on file   Highest education level: Associate degree: academic program  Occupational History   Not on file  Tobacco Use   Smoking status: Never   Smokeless tobacco: Never  Vaping  Use   Vaping status: Never Used  Substance and Sexual Activity   Alcohol use: No   Drug use: No   Sexual activity: Never  Other Topics Concern   Not on file  Social History Narrative   Full time student/plan to enter PA Program/currently [employer]   1 son: 52 yrs old   Regular exercise: some   Caffeine use: 1 cup of coffee daily   Social Drivers of Corporate investment banker Strain: Low Risk  (01/07/2024)   Overall Financial Resource Strain (CARDIA)    Difficulty of Paying Living Expenses: Not very hard  Food Insecurity: No Food Insecurity (01/07/2024)   Hunger Vital Sign    Worried About Running Out of Food in the Last Year: Never true    Ran Out of Food in the Last Year: Never true  Transportation Needs: No Transportation Needs (01/07/2024)   PRAPARE - Administrator, Civil Service (Medical): No    Lack of Transportation (Non-Medical): No  Physical Activity: Insufficiently Active (01/07/2024)   Exercise Vital Sign    Days of Exercise per Week: 2 days    Minutes of Exercise per Session: 30 min  Stress: No Stress Concern Present (01/07/2024)   [employer] of Occupational Health - Occupational Stress Questionnaire    Feeling of Stress : Only a little  Social Connections: Unknown (  01/07/2024)   Social Connection and Isolation Panel [NHANES]    Frequency of Communication with Friends and Family: Three times a week    Frequency of Social Gatherings with Friends and Family: Once a week    Attends Religious Services: Never    Database administrator or Organizations: No    Attends Engineer, structural: Not on file    Marital Status: Patient declined   History reviewed. No pertinent family history. Allergies  Allergen Reactions   Aspirin Other (See Comments)    Severe migraines    Current Outpatient Medications  Medication Sig Dispense Refill   traMADol (ULTRAM) 50 MG tablet Take 1 tablet (50 mg total) by mouth every 6 (six) hours as needed for up to 3 days.  12 tablet 0   albuterol (VENTOLIN HFA) 108 (90 Base) MCG/ACT inhaler Inhale 2 puffs into the lungs every 6 (six) hours as needed for wheezing or shortness of breath. 8 g 0   ALPRAZolam (XANAX) 0.5 MG tablet Take 1 tablet (0.5 mg total) by mouth 2 (two) times daily as needed for anxiety. 10 tablet 3   buPROPion (WELLBUTRIN XL) 150 MG 24 hr tablet Take 1 tablet (150 mg total) by mouth every morning. 30 tablet 3   fluconazole (DIFLUCAN) 150 MG tablet Take one tablet by mouth x 1 day. May repeat in 72 hours if symptoms don't improve. 2 tablet 0   hydrocortisone (ANUSOL-HC) 2.5 % rectal cream Place 1 Application rectally 2 (two) times daily. 30 g 0   methocarbamol (ROBAXIN) 500 MG tablet Take 1 tablet (500 mg total) by mouth 3 (three) times daily. 90 tablet 0   predniSONE (DELTASONE) 20 MG tablet Take 2 tablets (40 mg total) by mouth daily with breakfast. 10 tablet 0   rizatriptan (MAXALT) 5 MG tablet Take 1 tablet (5 mg total) by mouth as needed for migraine. May repeat in 2 hours if needed 18 tablet 0   Semaglutide, 2 MG/DOSE, 8 MG/3ML SOPN Semaglutide 2.5 mg/mL + Vit B6 10mg /mL. Inject 100u/65mL/2.5mg  subcu weekly. 2 mL 11   tretinoin (RETIN-A) 0.1 % cream Apply topically at bedtime. 45 g 11   No current facility-administered medications for this visit.   No results found.  Review of Systems:   A ROS was performed including pertinent positives and negatives as documented in the HPI.  Physical Exam :   Constitutional: NAD and appears stated age Neurological: Alert and oriented Psych: Appropriate affect and cooperative There were no vitals taken for this visit.   Comprehensive Musculoskeletal Exam:    Right shoulder exam demonstrates no significant tenderness with palpation throughout.  Active range of motion to 90 degrees forward flexion and 20 degrees external rotation.  Positive Hawkins impingement.  Negative empty can.  Mild warmth noted in the lateral forearm without evidence of notable  swelling compared to contralateral side.  Distal neurosensory exam is intact.  Radial pulse 2+.  Imaging:   Xray (right humerus 2 views): Negative for bony abnormality   I personally reviewed and interpreted the radiographs.   Assessment and Plan:    Right shoulder pain   Acute right shoulder pain is likely due to rotator cuff pathology, with normal X-rays.  A subacromial cortisone injection will be administered for both diagnostic and therapeutic purposes. Low suspicion given testing today however will still consider cervical radiculopathy and DVT as differentials. Monitor for changes in sensation, swelling, redness, or warmth and report immediately. Prescribe tramadol for pain as needed. Advise ice  application to reduce inflammation. Provide update on her response to the injection within 24-36 hours. If there is no improvement, will likely consider further imaging if indicated.      Procedure Note  Patient: Carla Morris             Date of Birth: 1981-07-24           MRN: [id number]             Visit Date: 02/14/2024  Procedures: Visit Diagnoses:  1. Acute pain of right shoulder     Large Joint Inj: R subacromial bursa on 02/14/2024 4:54 PM Indications: pain Details: 22 G 1.5 in needle, posterior approach Medications: 4 mL lidocaine 1 %; 2 mL triamcinolone acetonide 40 MG/ML Outcome: tolerated well, no immediate complications Procedure, treatment alternatives, risks and benefits explained, specific risks discussed. Consent was given by the patient. Immediately prior to procedure a time out was called to verify the correct patient, procedure, equipment, support staff and site/side marked as required. Patient was prepped and draped in the usual sterile fashion.       I personally saw and evaluated the patient, and participated in the management and treatment plan.  Hazle Nordmann, PA-C Orthopedics

## 2024-03-26 ENCOUNTER — Other Ambulatory Visit: Payer: Self-pay

## 2024-03-28 ENCOUNTER — Other Ambulatory Visit: Payer: Self-pay

## 2024-03-31 ENCOUNTER — Encounter: Payer: Self-pay | Admitting: Obstetrics & Gynecology

## 2024-04-01 ENCOUNTER — Other Ambulatory Visit (HOSPITAL_COMMUNITY): Payer: Self-pay

## 2024-04-01 ENCOUNTER — Other Ambulatory Visit: Payer: Self-pay

## 2024-04-02 ENCOUNTER — Other Ambulatory Visit (HOSPITAL_COMMUNITY)
Admission: RE | Admit: 2024-04-02 | Discharge: 2024-04-02 | Disposition: A | Discharge status: Home / Self Care | Source: Ambulatory Visit | Attending: Obstetrics & Gynecology | Admitting: Obstetrics & Gynecology

## 2024-04-02 ENCOUNTER — Encounter: Payer: Self-pay | Admitting: Sports Medicine

## 2024-04-02 ENCOUNTER — Ambulatory Visit (INDEPENDENT_AMBULATORY_CARE_PROVIDER_SITE_OTHER): Admitting: Sports Medicine

## 2024-04-02 ENCOUNTER — Ambulatory Visit: Admitting: Obstetrics & Gynecology

## 2024-04-02 ENCOUNTER — Encounter: Payer: Self-pay | Admitting: Obstetrics & Gynecology

## 2024-04-02 VITALS — BP 108/73 | HR 98 | Ht 64.0 in | Wt 158.0 lb

## 2024-04-02 VITALS — BP 96/65 | HR 82 | Resp 20 | Ht 65.0 in | Wt 160.0 lb

## 2024-04-02 DIAGNOSIS — Z8669 Personal history of other diseases of the nervous system and sense organs: Secondary | ICD-10-CM

## 2024-04-02 DIAGNOSIS — Z113 Encounter for screening for infections with a predominantly sexual mode of transmission: Secondary | ICD-10-CM | POA: Diagnosis not present

## 2024-04-02 DIAGNOSIS — Z01419 Encounter for gynecological examination (general) (routine) without abnormal findings: Secondary | ICD-10-CM | POA: Insufficient documentation

## 2024-04-02 DIAGNOSIS — E66811 Obesity, class 1: Secondary | ICD-10-CM

## 2024-04-02 DIAGNOSIS — F419 Anxiety disorder, unspecified: Secondary | ICD-10-CM | POA: Diagnosis not present

## 2024-04-02 DIAGNOSIS — D509 Iron deficiency anemia, unspecified: Secondary | ICD-10-CM | POA: Diagnosis not present

## 2024-04-02 DIAGNOSIS — E559 Vitamin D deficiency, unspecified: Secondary | ICD-10-CM | POA: Diagnosis not present

## 2024-04-02 DIAGNOSIS — Z1331 Encounter for screening for depression: Secondary | ICD-10-CM | POA: Diagnosis not present

## 2024-04-02 DIAGNOSIS — F32A Depression, unspecified: Secondary | ICD-10-CM | POA: Diagnosis not present

## 2024-04-02 DIAGNOSIS — E6609 Other obesity due to excess calories: Secondary | ICD-10-CM | POA: Diagnosis not present

## 2024-04-02 DIAGNOSIS — E538 Deficiency of other specified B group vitamins: Secondary | ICD-10-CM

## 2024-04-02 DIAGNOSIS — E782 Mixed hyperlipidemia: Secondary | ICD-10-CM

## 2024-04-02 DIAGNOSIS — N939 Abnormal uterine and vaginal bleeding, unspecified: Secondary | ICD-10-CM

## 2024-04-02 DIAGNOSIS — Z0000 Encounter for general adult medical examination without abnormal findings: Secondary | ICD-10-CM | POA: Diagnosis not present

## 2024-04-02 MED ORDER — TIRZEPATIDE 10 MG/0.5ML ~~LOC~~ SOAJ
SUBCUTANEOUS | 11 refills | Status: AC
Start: 2024-04-02 — End: ?

## 2024-04-02 NOTE — Progress Notes (Signed)
 Subjective:     Carla Morris is a 43 y.o. female here for a routine exam.  Current complaints: Pt reports that her cycles have been irreg for the past 3 months. Pt reports a h/o PCOS.   Pt is sexually active with no contraception. Does not suspect an STI but, agrees with testing.     Gynecologic History Patient's last menstrual period was 03/15/2024. Contraception: none Last Pap: 07/05/2022. Results were: normal Last mammogram: 05/25/2022. Results were: normal  Obstetric History OB History  Gravida Para Term Preterm AB Living  2 1 1  1 1   SAB IAB Ectopic Multiple Live Births   1   1    # Outcome Date GA Lbr Len/2nd Weight Sex Type Anes PTL Lv  2 Term 2009 [redacted]w[redacted]d   M Vag-Spont EPI N LIV  1 IAB              The following portions of the patient's history were reviewed and updated as appropriate: allergies, current medications, past family history, past medical history, past social history, past surgical history, and problem list.  Review of Systems Pertinent items are noted in HPI.    Objective:  BP 108/73   Pulse 98   Ht 5\' 4"  (1.626 m)   Wt 158 lb (71.7 kg)   LMP 03/15/2024   BMI 27.12 kg/m   General Appearance:    Alert, cooperative, no distress, appears stated age  Head:    Normocephalic, without obvious abnormality, atraumatic  Eyes:    conjunctiva/corneas clear, EOM's intact, both eyes  Ears:    Normal external ear canals, both ears  Nose:   Nares normal, septum midline, mucosa normal, no drainage    or sinus tenderness  Throat:   Lips, mucosa, and tongue normal; teeth and gums normal  Neck:   Supple, symmetrical, trachea midline, no adenopathy;    thyroid :  no enlargement/tenderness/nodules  Back:     Symmetric, no curvature, ROM normal, no CVA tenderness  Lungs:     respirations unlabored  Chest Wall:    No tenderness or deformity   Heart:    Regular rate and rhythm  Breast Exam:    No tenderness, masses, or nipple abnormality  Abdomen:     Soft, non-tender, bowel  sounds active all four quadrants,    no masses, no organomegaly  Genitalia:    Normal female without lesion, discharge or tenderness   Uterus- sl enlarged. Nontender, No adnexal masses.      Extremities:   Extremities normal, atraumatic, no cyanosis or edema  Pulses:   2+ and symmetric all extremities  Skin:   Skin color, texture, turgor normal, no rashes or lesions     Assessment:    Healthy female exam.  AUB for 3 months  H/I abnormal PAP- reviewed hrHPV and long term management of abnormal PAP    Plan:   Carla Morris was seen today for gynecologic exam.  Diagnoses and all orders for this visit:  Well female exam with routine gynecological exam -     Cytology - PAP( [place])  Abnormal uterine bleeding (AUB) -     US  PELVIS TRANSVAGINAL NON-OB (TV ONLY); Future  Routine screening for STI (sexually transmitted infection) -     Cervicovaginal ancillary only( )   F/u in 4 weeks to review results.  F/u in 1 year for annual GYN exam   Jerome Otter L. Harraway-Smith, M.D., FACOG

## 2024-04-02 NOTE — Assessment & Plan Note (Addendum)
 Worsening depressive symptoms with fatigue. We did discuss the importance of pharmacotherapy, behavioral therapy, and exercise therapy, I gave her an exercise prescription, we will bump Wellbutrin  up to 300 mg. She is opposed to SSRIs for now. As she was having fatigue we are going to do a bit of a serum workup as well. Will consider sleep study as well at the follow-up if fatigue not doing better but depressive symptoms improved. Return in 6 weeks to reevaluate.  Update: See below under anemia diagnosis, this explains fatigue.

## 2024-04-02 NOTE — Progress Notes (Addendum)
 Subjective:    CC: Annual Physical Exam  HPI:  This patient is here for their annual physical  I reviewed the past medical history, family history, social history, surgical history, and allergies today and no changes were needed.  Please see the problem list section below in epic for further details.  Past Medical History: Past Medical History:  Diagnosis Date   Anxiety    Asthma    Pars defect    Past Surgical History: Past Surgical History:  Procedure Laterality Date   NO PAST SURGERIES     Social History: Social History   Socioeconomic History   Marital status: Significant Other    Spouse name: Not on file   Number of children: Not on file   Years of education: Not on file   Highest education level: Associate degree: academic program  Occupational History   Not on file  Tobacco Use   Smoking status: Never   Smokeless tobacco: Never  Vaping Use   Vaping status: Never Used  Substance and Sexual Activity   Alcohol use: No   Drug use: No   Sexual activity: Never  Other Topics Concern   Not on file  Social History Narrative   Full time student/plan to enter PA Program/currently [employer]   1 son: 11 yrs old   Regular exercise: some   Caffeine use: 1 cup of coffee daily   Social Drivers of Corporate Investment Banker Strain: Low Risk  (01/07/2024)   Overall Financial Resource Strain (CARDIA)    Difficulty of Paying Living Expenses: Not very hard  Food Insecurity: No Food Insecurity (01/07/2024)   Hunger Vital Sign    Worried About Running Out of Food in the Last Year: Never true    Ran Out of Food in the Last Year: Never true  Transportation Needs: No Transportation Needs (01/07/2024)   PRAPARE - Administrator, Civil Service (Medical): No    Lack of Transportation (Non-Medical): No  Physical Activity: Insufficiently Active (01/07/2024)   Exercise Vital Sign    Days of Exercise per Week: 2 days    Minutes of Exercise per Session: 30 min  Stress: No  Stress Concern Present (01/07/2024)   [employer] of Occupational Health - Occupational Stress Questionnaire    Feeling of Stress : Only a little  Social Connections: Unknown (01/07/2024)   Social Connection and Isolation Panel [NHANES]    Frequency of Communication with Friends and Family: Three times a week    Frequency of Social Gatherings with Friends and Family: Once a week    Attends Religious Services: Never    Database Administrator or Organizations: No    Attends Engineer, Structural: Not on file    Marital Status: Patient declined   Family History: History reviewed. No pertinent family history. Allergies: Allergies  Allergen Reactions   Aspirin Other (See Comments)    Severe migraines    Medications: See med rec.  Review of Systems: No headache, visual changes, nausea, vomiting, diarrhea, constipation, dizziness, abdominal pain, skin rash, fevers, chills, night sweats, swollen lymph nodes, weight loss, chest pain, body aches, joint swelling, muscle aches, shortness of breath, mood changes, visual or auditory hallucinations.  Objective:    General: Well Developed, well nourished, and in no acute distress.  Neuro: Alert and oriented x3, extra-ocular muscles intact, sensation grossly intact. Cranial nerves II through XII are intact, motor, sensory, and coordinative functions are all intact. HEENT: Normocephalic, atraumatic, pupils equal round reactive  to light, neck supple, no masses, no lymphadenopathy, thyroid  nonpalpable. Oropharynx, nasopharynx, external ear canals are unremarkable. Skin: Warm and dry, no rashes noted.  Cardiac: Regular rate and rhythm, no murmurs rubs or gallops.  Respiratory: Clear to auscultation bilaterally. Not using accessory muscles, speaking in full sentences.  Abdominal: Soft, nontender, nondistended, positive bowel sounds, no masses, no organomegaly.  Musculoskeletal: Shoulder, elbow, wrist, hip, knee, ankle stable, and with full  range of motion.  Impression and Recommendations:    The patient was counselled, risk factors were discussed, anticipatory guidance given.  Annual physical exam Annual physical as above Routine labs ordered. She is can get her mammogram. Return to see me in a year for this. I did fill out annual FMLA paperwork today.  Anxiety and depression Worsening depressive symptoms with fatigue. We did discuss the importance of pharmacotherapy, behavioral therapy, and exercise therapy, I gave her an exercise prescription, we will bump Wellbutrin  up to 300 mg. She is opposed to SSRIs for now. As she was having fatigue we are going to do a bit of a serum workup as well. Will consider sleep study as well at the follow-up if fatigue not doing better but depressive symptoms improved. Return in 6 weeks to reevaluate.  Update: See below under anemia diagnosis, this explains fatigue.  History of migraine headaches I filled out annual FMLA paperwork today for migraines.  Obesity Dramatic improvement with compounded semaglutide , we do need to switch to compounded tirzepatide . We will start her middle of the range.  Microcytic anemia Labs do reveal moderate to severe anemia, microcytic, please add iron indices to the blood in the lab, I will order these now.  Patient should start iron sulfate 3 times daily, I will order this at the pharmacy.  I would like to see her back in about a month to discuss and recheck.   ____________________________________________ Debby PARAS. Curtis, M.D., ABFM., CAQSM., AME. Primary Care and Sports Medicine [facility]  Adjunct Professor of [facility] Medicine  [employer]  Restaurant Manager, [employer]

## 2024-04-02 NOTE — Assessment & Plan Note (Signed)
 Annual physical as above Routine labs ordered. She is can get her mammogram. Return to see me in a year for this. I did fill out annual FMLA paperwork today.

## 2024-04-02 NOTE — Assessment & Plan Note (Signed)
 I filled out annual FMLA paperwork today for migraines.

## 2024-04-02 NOTE — Assessment & Plan Note (Addendum)
 Dramatic improvement with compounded semaglutide , we do need to switch to compounded tirzepatide. We will start her middle of the range.

## 2024-04-03 ENCOUNTER — Other Ambulatory Visit: Payer: Self-pay | Admitting: Sports Medicine

## 2024-04-03 ENCOUNTER — Ambulatory Visit: Payer: Self-pay | Admitting: Sports Medicine

## 2024-04-03 ENCOUNTER — Other Ambulatory Visit (HOSPITAL_BASED_OUTPATIENT_CLINIC_OR_DEPARTMENT_OTHER): Payer: Self-pay

## 2024-04-03 DIAGNOSIS — Z1231 Encounter for screening mammogram for malignant neoplasm of breast: Secondary | ICD-10-CM

## 2024-04-03 LAB — CBC
Hematocrit: 32.8 % — ABNORMAL LOW (ref 34.0–46.6)
Hemoglobin: 9.7 g/dL — ABNORMAL LOW (ref 11.1–15.9)
MCH: 21.3 pg — ABNORMAL LOW (ref 26.6–33.0)
MCHC: 29.6 g/dL — ABNORMAL LOW (ref 31.5–35.7)
MCV: 72 fL — ABNORMAL LOW (ref 79–97)
Platelets: 428 10*3/uL (ref 150–450)
RBC: 4.55 x10E6/uL (ref 3.77–5.28)
RDW: 13.8 % (ref 11.7–15.4)
WBC: 7.3 10*3/uL (ref 3.4–10.8)

## 2024-04-03 LAB — COMPREHENSIVE METABOLIC PANEL WITH GFR
ALT: 11 IU/L (ref 0–32)
AST: 15 IU/L (ref 0–40)
Albumin: 4.2 g/dL (ref 3.9–4.9)
Alkaline Phosphatase: 56 IU/L (ref 44–121)
BUN/Creatinine Ratio: 20 (ref 9–23)
BUN: 11 mg/dL (ref 6–24)
Bilirubin Total: 0.3 mg/dL (ref 0.0–1.2)
CO2: 22 mmol/L (ref 20–29)
Calcium: 9.2 mg/dL (ref 8.7–10.2)
Chloride: 104 mmol/L (ref 96–106)
Creatinine, Ser: 0.54 mg/dL — ABNORMAL LOW (ref 0.57–1.00)
Globulin, Total: 2.7 g/dL (ref 1.5–4.5)
Glucose: 72 mg/dL (ref 70–99)
Potassium: 4.5 mmol/L (ref 3.5–5.2)
Sodium: 138 mmol/L (ref 134–144)
Total Protein: 6.9 g/dL (ref 6.0–8.5)
eGFR: 118 mL/min/{1.73_m2} (ref >59)

## 2024-04-03 LAB — LIPID PANEL
Chol/HDL Ratio: 3.1 ratio (ref 0.0–4.4)
Cholesterol, Total: 205 mg/dL — ABNORMAL HIGH (ref 100–199)
HDL: 66 mg/dL (ref >39)
LDL Chol Calc (NIH): 125 mg/dL — ABNORMAL HIGH (ref 0–99)
Triglycerides: 76 mg/dL (ref 0–149)
VLDL Cholesterol Cal: 14 mg/dL (ref 5–40)

## 2024-04-03 LAB — VITAMIN B12: Vitamin B-12: 327 pg/mL (ref 232–1245)

## 2024-04-03 LAB — VITAMIN D 25 HYDROXY (VIT D DEFICIENCY, FRACTURES): Vit D, 25-Hydroxy: 29.2 ng/mL — ABNORMAL LOW (ref 30.0–100.0)

## 2024-04-03 LAB — HEMOGLOBIN A1C
Est. average glucose Bld gHb Est-mCnc: 105 mg/dL
Hgb A1c MFr Bld: 5.3 % (ref 4.8–5.6)

## 2024-04-03 LAB — TSH: TSH: 1.01 u[IU]/mL (ref 0.450–4.500)

## 2024-04-03 MED ORDER — FERROUS SULFATE 325 (65 FE) MG PO TBEC
325.0000 mg | DELAYED_RELEASE_TABLET | Freq: Three times a day (TID) | ORAL | 11 refills | Status: DC
  Filled 2024-04-03: qty 90, 30d supply, fill #0

## 2024-04-03 NOTE — Assessment & Plan Note (Signed)
 Labs do reveal moderate to severe anemia, microcytic, please add iron indices to the blood in the lab, I will order these now.  Patient should start iron sulfate 3 times daily, I will order this at the pharmacy.  I would like to see her back in about a month to discuss and recheck.

## 2024-04-03 NOTE — Addendum Note (Signed)
 Addended by: Gean Keels on: 04/03/2024 12:06 PM   Modules accepted: Orders

## 2024-04-05 LAB — IRON AND TIBC
Iron Saturation: 5 % — CL (ref 15–55)
Iron: 20 ug/dL — ABNORMAL LOW (ref 27–159)
Total Iron Binding Capacity: 425 ug/dL (ref 250–450)
UIBC: 405 ug/dL (ref 131–425)

## 2024-04-05 LAB — FERRITIN: Ferritin: 4 ng/mL — ABNORMAL LOW (ref 15–150)

## 2024-04-05 LAB — SPECIMEN STATUS REPORT

## 2024-04-08 ENCOUNTER — Encounter (HOSPITAL_BASED_OUTPATIENT_CLINIC_OR_DEPARTMENT_OTHER): Payer: Self-pay | Admitting: Radiology

## 2024-04-08 ENCOUNTER — Ambulatory Visit (HOSPITAL_BASED_OUTPATIENT_CLINIC_OR_DEPARTMENT_OTHER)
Admission: RE | Admit: 2024-04-08 | Discharge: 2024-04-08 | Disposition: A | Discharge status: Home / Self Care | Source: Ambulatory Visit

## 2024-04-08 ENCOUNTER — Telehealth: Payer: Self-pay

## 2024-04-08 ENCOUNTER — Other Ambulatory Visit: Payer: Self-pay | Admitting: Obstetrics & Gynecology

## 2024-04-08 ENCOUNTER — Ambulatory Visit (HOSPITAL_BASED_OUTPATIENT_CLINIC_OR_DEPARTMENT_OTHER)
Admission: RE | Admit: 2024-04-08 | Discharge: 2024-04-08 | Disposition: A | Discharge status: Home / Self Care | Payer: Self-pay | Source: Ambulatory Visit | Attending: Obstetrics & Gynecology | Admitting: Obstetrics & Gynecology

## 2024-04-08 DIAGNOSIS — Z1231 Encounter for screening mammogram for malignant neoplasm of breast: Secondary | ICD-10-CM | POA: Insufficient documentation

## 2024-04-08 DIAGNOSIS — Z01419 Encounter for gynecological examination (general) (routine) without abnormal findings: Secondary | ICD-10-CM

## 2024-04-08 DIAGNOSIS — N939 Abnormal uterine and vaginal bleeding, unspecified: Secondary | ICD-10-CM | POA: Insufficient documentation

## 2024-04-08 DIAGNOSIS — Z113 Encounter for screening for infections with a predominantly sexual mode of transmission: Secondary | ICD-10-CM

## 2024-04-08 DIAGNOSIS — N888 Other specified noninflammatory disorders of cervix uteri: Secondary | ICD-10-CM | POA: Diagnosis not present

## 2024-04-08 LAB — CERVICOVAGINAL ANCILLARY ONLY
Bacterial Vaginitis (gardnerella): NEGATIVE
Candida Glabrata: NEGATIVE
Candida Vaginitis: NEGATIVE
Chlamydia: POSITIVE — AB
Comment: NEGATIVE
Comment: NEGATIVE
Comment: NEGATIVE
Comment: NEGATIVE
Comment: NEGATIVE
Comment: NORMAL
Neisseria Gonorrhea: NEGATIVE
Trichomonas: NEGATIVE

## 2024-04-08 NOTE — Telephone Encounter (Signed)
 Meiko,Radiology tech at [facility] called stating patient will need Transvag and Pelvic US . US  was added.  Kendria Halberg l Lleyton Byers, CMA

## 2024-04-09 ENCOUNTER — Other Ambulatory Visit: Payer: Self-pay

## 2024-04-09 ENCOUNTER — Other Ambulatory Visit: Payer: Self-pay | Admitting: Obstetrics & Gynecology

## 2024-04-09 ENCOUNTER — Encounter: Payer: Self-pay | Admitting: Obstetrics & Gynecology

## 2024-04-09 ENCOUNTER — Ambulatory Visit: Payer: Self-pay | Admitting: Obstetrics & Gynecology

## 2024-04-09 DIAGNOSIS — A749 Chlamydial infection, unspecified: Secondary | ICD-10-CM

## 2024-04-09 MED ORDER — DOXYCYCLINE HYCLATE 100 MG PO CAPS: 100.0000 mg | ORAL_CAPSULE | Freq: Two times a day (BID) | ORAL | 0 refills | Status: AC

## 2024-04-14 ENCOUNTER — Encounter: Payer: Self-pay | Admitting: Obstetrics & Gynecology

## 2024-04-15 LAB — CYTOLOGY - PAP
Comment: NEGATIVE
Diagnosis: UNDETERMINED — AB
High risk HPV: NEGATIVE

## 2024-04-16 ENCOUNTER — Ambulatory Visit: Payer: Self-pay | Admitting: Obstetrics & Gynecology

## 2024-04-17 ENCOUNTER — Other Ambulatory Visit (HOSPITAL_BASED_OUTPATIENT_CLINIC_OR_DEPARTMENT_OTHER): Payer: Self-pay

## 2024-05-22 ENCOUNTER — Encounter: Payer: Self-pay | Admitting: Sports Medicine

## 2024-05-29 ENCOUNTER — Telehealth: Admitting: Physician Assistant

## 2024-05-29 DIAGNOSIS — K0889 Other specified disorders of teeth and supporting structures: Secondary | ICD-10-CM

## 2024-05-29 DIAGNOSIS — H9202 Otalgia, left ear: Secondary | ICD-10-CM

## 2024-05-29 NOTE — Progress Notes (Signed)
   Thank you for the details you included in the comment boxes. Those details are very helpful in determining the best course of treatment for you and help us  to provide the best care.Because of some atypical symptoms and need for further evaluation, we recommend that you schedule a Virtual Urgent Care video visit in order for the provider to better assess what is going on.  The provider will be able to give you a more accurate diagnosis and treatment plan if we can more freely discuss your symptoms and with the addition of a virtual examination.   If you change your visit to a video visit, we will bill your insurance (similar to an office visit) and you will not be charged for this e-Visit. You will be able to stay at home and speak with the first available [facility] advanced practice provider. The link to do a video visit is in the drop down Menu tab of your Welcome screen in MyChart.

## 2024-05-30 ENCOUNTER — Other Ambulatory Visit: Payer: Self-pay

## 2024-06-07 ENCOUNTER — Telehealth: Admitting: Nurse Practitioner

## 2024-06-07 DIAGNOSIS — L739 Follicular disorder, unspecified: Secondary | ICD-10-CM | POA: Diagnosis not present

## 2024-06-07 MED ORDER — MUPIROCIN CALCIUM 2 % EX CREA: 1.0000 | TOPICAL_CREAM | Freq: Three times a day (TID) | CUTANEOUS | 0 refills | Status: AC

## 2024-06-07 NOTE — Progress Notes (Signed)
 I have spent 5 minutes in review of e-visit questionnaire, review and updating patient chart, medical decision making and response to patient.   Claiborne Rigg, NP

## 2024-06-07 NOTE — Progress Notes (Signed)
E Visit for Rash  We are sorry that you are not feeling well. Here is how we plan to help!   Based upon what you have shared with me it looks like you have a bacterial follicultits.  Folliculitis is inflammation of the hair follicles that can be caused by a superficial infection of the skin and is treated with an antibiotic. I have prescribed: and Topical mupiricin      HOME CARE:  Take cool showers and avoid direct sunlight. Apply cool compress or wet dressings. Take a bath in an oatmeal bath.  Sprinkle content of one Aveeno packet under running faucet with comfortably warm water.  Bathe for 15-20 minutes, 1-2 times daily.  Pat dry with a towel. Do not rub the rash. Use hydrocortisone cream. Take an antihistamine like Benadryl for widespread rashes that itch.  The adult dose of Benadryl is 25-50 mg by mouth 4 times daily. Caution:  This type of medication may cause sleepiness.  Do not drink alcohol, drive, or operate dangerous machinery while taking antihistamines.  Do not take these medications if you have prostate enlargement.  Read package instructions thoroughly on all medications that you take.  GET HELP RIGHT AWAY IF:  Symptoms don't go away after treatment. Severe itching that persists. If you rash spreads or swells. If you rash begins to smell. If it blisters and opens or develops a yellow-brown crust. You develop a fever. You have a sore throat. You become short of breath.  MAKE SURE YOU:  Understand these instructions. Will watch your condition. Will get help right away if you are not doing well or get worse.  Thank you for choosing an e-visit.  Your e-visit answers were reviewed by a board certified advanced clinical practitioner to complete your personal care plan. Depending upon the condition, your plan could have included both over the counter or prescription medications.  Please review your pharmacy choice. Make sure the pharmacy is open so you can pick up  prescription now. If there is a problem, you may contact your provider through [employer] and have the prescription routed to another pharmacy.  Your safety is important to [place]. If you have drug allergies check your prescription carefully.   For the next 24 hours you can use MyChart to ask questions about today's visit, request a non-urgent call back, or ask for a work or school excuse. You will get an email in the next two days asking about your experience. I hope that your e-visit has been valuable and will speed your recovery.

## 2024-07-09 ENCOUNTER — Ambulatory Visit: Admitting: Sports Medicine

## 2024-07-09 ENCOUNTER — Other Ambulatory Visit (HOSPITAL_BASED_OUTPATIENT_CLINIC_OR_DEPARTMENT_OTHER): Payer: Self-pay

## 2024-07-09 ENCOUNTER — Encounter: Payer: Self-pay | Admitting: Sports Medicine

## 2024-07-09 VITALS — BP 114/81 | HR 92 | Temp 98.3°F | Ht 64.0 in | Wt 169.0 lb

## 2024-07-09 DIAGNOSIS — L02413 Cutaneous abscess of right upper limb: Secondary | ICD-10-CM | POA: Diagnosis not present

## 2024-07-09 DIAGNOSIS — L02411 Cutaneous abscess of right axilla: Secondary | ICD-10-CM | POA: Diagnosis not present

## 2024-07-09 MED ORDER — FLUCONAZOLE 150 MG PO TABS
150.0000 mg | ORAL_TABLET | Freq: Once | ORAL | 3 refills | Status: AC
  Filled 2024-07-09: qty 1, 1d supply, fill #0

## 2024-07-09 MED ORDER — RIFAMPIN 300 MG PO CAPS
300.0000 mg | ORAL_CAPSULE | Freq: Every day | ORAL | 0 refills | Status: AC
  Filled 2024-07-09 – 2024-11-01 (×2): qty 14, 14d supply, fill #0

## 2024-07-09 MED ORDER — TRAMADOL HCL 50 MG PO TABS
50.0000 mg | ORAL_TABLET | Freq: Three times a day (TID) | ORAL | 0 refills | Status: AC | PRN
  Filled 2024-07-09: qty 21, 7d supply, fill #0

## 2024-07-09 MED ORDER — DOXYCYCLINE HYCLATE 100 MG PO TABS
100.0000 mg | ORAL_TABLET | Freq: Two times a day (BID) | ORAL | 0 refills | Status: AC
  Filled 2024-07-09: qty 14, 7d supply, fill #0

## 2024-07-09 NOTE — Addendum Note (Signed)
 Addended by: CURTIS DEBBY PARAS on: 07/09/2024 10:27 AM   Modules accepted: Orders

## 2024-07-09 NOTE — Progress Notes (Addendum)
    Procedures performed today:    None.  Independent interpretation of notes and tests performed by another provider:   None.  Brief History, Exam, Impression, and Recommendations:    Abscess of arm, right Right axillary mass present for about a week, tender, warm. Septra was not effective. She had a few prior. No breaks in the skin. We will initially treat this as a simple abscess, no evidence of hidradenitis suppurativa at this juncture. With the current antibiogram best sensitivities for Staphylococcus aureus including MRSA are linezolid, tetracyclines, rifampin , nitrofurantoin . We will do doxycycline  and rifampin . She will do warm compresses, I like an ultrasound, return to see me in about 10 days.    ____________________________________________ Debby PARAS. Curtis, M.D., ABFM., CAQSM., AME. Primary Care and Sports Medicine [facility]  Adjunct Professor of [facility] Medicine  [employer]  [employer]  Restaurant manager, fast food

## 2024-07-09 NOTE — Addendum Note (Signed)
 Addended by: CURTIS DEBBY PARAS on: 07/09/2024 10:51 AM   Modules accepted: Orders

## 2024-07-09 NOTE — Assessment & Plan Note (Signed)
 Right axillary mass present for about a week, tender, warm. Septra was not effective. She had a few prior. No breaks in the skin. We will initially treat this as a simple abscess, no evidence of hidradenitis suppurativa at this juncture. With the current antibiogram best sensitivities for Staphylococcus aureus including MRSA are linezolid, tetracyclines, rifampin , nitrofurantoin . We will do doxycycline  and rifampin . She will do warm compresses, I like an ultrasound, return to see me in about 10 days.

## 2024-07-10 ENCOUNTER — Other Ambulatory Visit

## 2024-07-22 ENCOUNTER — Encounter: Payer: Self-pay | Admitting: Sports Medicine

## 2024-07-23 ENCOUNTER — Other Ambulatory Visit (HOSPITAL_BASED_OUTPATIENT_CLINIC_OR_DEPARTMENT_OTHER): Payer: Self-pay

## 2024-08-29 ENCOUNTER — Other Ambulatory Visit: Payer: Self-pay | Admitting: *Deleted

## 2024-08-29 DIAGNOSIS — Z006 Encounter for examination for normal comparison and control in clinical research program: Secondary | ICD-10-CM

## 2024-09-22 ENCOUNTER — Encounter: Payer: Self-pay | Admitting: Radiology

## 2024-10-15 ENCOUNTER — Ambulatory Visit: Admitting: Family Medicine

## 2024-11-01 ENCOUNTER — Other Ambulatory Visit (HOSPITAL_COMMUNITY): Payer: Self-pay

## 2024-11-02 ENCOUNTER — Other Ambulatory Visit (HOSPITAL_COMMUNITY): Payer: Self-pay

## 2024-11-18 ENCOUNTER — Ambulatory Visit: Admitting: Family Medicine

## 2024-12-15 ENCOUNTER — Ambulatory Visit: Admitting: Physician Assistant

## 2024-12-16 ENCOUNTER — Ambulatory Visit: Admitting: Physician Assistant

## 2025-01-02 ENCOUNTER — Ambulatory Visit: Admitting: Family Medicine

## 2025-02-18 ENCOUNTER — Ambulatory Visit: Admitting: Physician Assistant
